# Patient Record
Sex: Female | Born: 1980 | Race: Black or African American | Hispanic: No | Marital: Single | Smoking: Never smoker
Health system: Southern US, Community
[De-identification: ages and names within clinical notes are randomized; demographics above are authoritative.]

## PROBLEM LIST (undated history)

## (undated) ENCOUNTER — Inpatient Hospital Stay (HOSPITAL_COMMUNITY): Payer: Self-pay

## (undated) DIAGNOSIS — E079 Disorder of thyroid, unspecified: Secondary | ICD-10-CM

## (undated) DIAGNOSIS — F32A Depression, unspecified: Secondary | ICD-10-CM

## (undated) DIAGNOSIS — F419 Anxiety disorder, unspecified: Secondary | ICD-10-CM

## (undated) DIAGNOSIS — D649 Anemia, unspecified: Secondary | ICD-10-CM

## (undated) DIAGNOSIS — I10 Essential (primary) hypertension: Secondary | ICD-10-CM

## (undated) DIAGNOSIS — K219 Gastro-esophageal reflux disease without esophagitis: Secondary | ICD-10-CM

## (undated) DIAGNOSIS — F329 Major depressive disorder, single episode, unspecified: Secondary | ICD-10-CM

## (undated) DIAGNOSIS — Z5189 Encounter for other specified aftercare: Secondary | ICD-10-CM

## (undated) HISTORY — DX: Anxiety disorder, unspecified: F41.9

## (undated) HISTORY — DX: Depression, unspecified: F32.A

## (undated) HISTORY — DX: Gastro-esophageal reflux disease without esophagitis: K21.9

## (undated) HISTORY — DX: Essential (primary) hypertension: I10

## (undated) HISTORY — DX: Disorder of thyroid, unspecified: E07.9

## (undated) HISTORY — DX: Encounter for other specified aftercare: Z51.89

## (undated) HISTORY — DX: Major depressive disorder, single episode, unspecified: F32.9

## (undated) HISTORY — DX: Anemia, unspecified: D64.9

## (undated) HISTORY — PX: EYE SURGERY: SHX253

---

## 2008-10-19 ENCOUNTER — Encounter: Admission: RE | Admit: 2008-10-19 | Discharge: 2008-10-19 | Payer: Self-pay | Admitting: Internal Medicine

## 2008-10-21 ENCOUNTER — Emergency Department (HOSPITAL_COMMUNITY): Admission: EM | Admit: 2008-10-21 | Discharge: 2008-10-21 | Payer: Self-pay | Admitting: Family Medicine

## 2009-12-06 ENCOUNTER — Emergency Department (HOSPITAL_BASED_OUTPATIENT_CLINIC_OR_DEPARTMENT_OTHER): Admission: EM | Admit: 2009-12-06 | Discharge: 2009-12-06 | Payer: Self-pay | Admitting: Emergency Medicine

## 2010-07-03 ENCOUNTER — Ambulatory Visit: Payer: Self-pay | Admitting: Obstetrics & Gynecology

## 2010-07-03 ENCOUNTER — Encounter: Payer: Self-pay | Admitting: Obstetrics and Gynecology

## 2010-07-03 LAB — CONVERTED CEMR LAB
Antibody Screen: NEGATIVE
Basophils Absolute: 0 10*3/uL (ref 0.0–0.1)
Basophils Relative: 0 % (ref 0–1)
Eosinophils Absolute: 0 10*3/uL (ref 0.0–0.7)
Eosinophils Relative: 0 % (ref 0–5)
Free T4: 3.33 ng/dL — ABNORMAL HIGH (ref 0.80–1.80)
HCT: 37.3 % (ref 36.0–46.0)
Hemoglobin: 12.6 g/dL (ref 12.0–15.0)
Hepatitis B Surface Ag: NEGATIVE
Lymphocytes Relative: 19 % (ref 12–46)
Lymphs Abs: 1.9 10*3/uL (ref 0.7–4.0)
MCHC: 33.8 g/dL (ref 30.0–36.0)
MCV: 80.2 fL (ref 78.0–100.0)
Monocytes Absolute: 0.7 10*3/uL (ref 0.1–1.0)
Monocytes Relative: 7 % (ref 3–12)
Neutro Abs: 7.6 10*3/uL (ref 1.7–7.7)
Neutrophils Relative %: 74 % (ref 43–77)
Pap Smear: NEGATIVE
Platelets: 352 10*3/uL (ref 150–400)
RBC: 4.65 M/uL (ref 3.87–5.11)
RDW: 13.6 % (ref 11.5–15.5)
Rh Type: POSITIVE
Rubella: 31.8 intl units/mL — ABNORMAL HIGH
T3, Free: 10.9 pg/mL — ABNORMAL HIGH (ref 2.3–4.2)
TSH: <0.004 microintl units/mL — ABNORMAL LOW (ref 0.350–4.500)
WBC: 10.3 10*3/uL (ref 4.0–10.5)

## 2010-07-04 ENCOUNTER — Encounter (INDEPENDENT_AMBULATORY_CARE_PROVIDER_SITE_OTHER): Payer: Self-pay | Admitting: *Deleted

## 2010-07-05 ENCOUNTER — Ambulatory Visit (HOSPITAL_COMMUNITY): Admission: RE | Admit: 2010-07-05 | Discharge: 2010-07-05 | Payer: Self-pay | Admitting: Family Medicine

## 2010-07-10 ENCOUNTER — Ambulatory Visit: Payer: Self-pay | Admitting: Obstetrics & Gynecology

## 2010-07-10 ENCOUNTER — Encounter (INDEPENDENT_AMBULATORY_CARE_PROVIDER_SITE_OTHER): Payer: Self-pay | Admitting: *Deleted

## 2010-07-24 ENCOUNTER — Ambulatory Visit: Payer: Self-pay | Admitting: Obstetrics and Gynecology

## 2010-08-01 ENCOUNTER — Ambulatory Visit (HOSPITAL_COMMUNITY): Admission: RE | Admit: 2010-08-01 | Discharge: 2010-08-01 | Payer: Self-pay | Admitting: Obstetrics & Gynecology

## 2010-08-07 ENCOUNTER — Encounter: Payer: Self-pay | Admitting: Physician Assistant

## 2010-08-07 ENCOUNTER — Ambulatory Visit: Payer: Self-pay | Admitting: Family Medicine

## 2010-08-08 ENCOUNTER — Encounter: Payer: Self-pay | Admitting: Physician Assistant

## 2010-08-08 LAB — CONVERTED CEMR LAB: HIV: NONREACTIVE

## 2010-08-22 ENCOUNTER — Inpatient Hospital Stay (HOSPITAL_COMMUNITY): Admission: AD | Admit: 2010-08-22 | Discharge: 2010-08-22 | Payer: Self-pay | Admitting: Obstetrics & Gynecology

## 2010-08-22 ENCOUNTER — Ambulatory Visit (HOSPITAL_COMMUNITY)
Admission: RE | Admit: 2010-08-22 | Discharge: 2010-08-22 | Payer: Self-pay | Source: Home / Self Care | Admitting: Obstetrics & Gynecology

## 2010-08-28 ENCOUNTER — Ambulatory Visit: Payer: Self-pay | Admitting: Obstetrics & Gynecology

## 2010-09-11 ENCOUNTER — Ambulatory Visit (HOSPITAL_COMMUNITY)
Admission: RE | Admit: 2010-09-11 | Discharge: 2010-09-11 | Payer: Self-pay | Source: Home / Self Care | Admitting: Obstetrics & Gynecology

## 2010-09-15 ENCOUNTER — Inpatient Hospital Stay (HOSPITAL_COMMUNITY)
Admission: AD | Admit: 2010-09-15 | Discharge: 2010-09-15 | Payer: Self-pay | Source: Home / Self Care | Attending: Family Medicine | Admitting: Family Medicine

## 2010-09-18 ENCOUNTER — Encounter: Payer: Self-pay | Admitting: Obstetrics & Gynecology

## 2010-09-18 ENCOUNTER — Ambulatory Visit: Payer: Self-pay | Admitting: Family Medicine

## 2010-10-16 ENCOUNTER — Ambulatory Visit (HOSPITAL_COMMUNITY)
Admission: RE | Admit: 2010-10-16 | Discharge: 2010-10-16 | Payer: Self-pay | Source: Home / Self Care | Attending: Obstetrics & Gynecology | Admitting: Obstetrics & Gynecology

## 2010-10-16 ENCOUNTER — Ambulatory Visit
Admission: RE | Admit: 2010-10-16 | Discharge: 2010-10-16 | Payer: Self-pay | Source: Home / Self Care | Attending: Obstetrics and Gynecology | Admitting: Obstetrics and Gynecology

## 2010-10-23 LAB — POCT URINALYSIS DIPSTICK
Bilirubin Urine: NEGATIVE
Ketones, ur: NEGATIVE mg/dL
Nitrite: NEGATIVE
Protein, ur: 30 mg/dL — AB
Specific Gravity, Urine: >=1.03 (ref 1.005–1.030)
Urine Glucose, Fasting: NEGATIVE mg/dL
Urobilinogen, UA: 1 mg/dL (ref 0.0–1.0)
pH: 6.5 (ref 5.0–8.0)

## 2010-10-26 ENCOUNTER — Ambulatory Visit (HOSPITAL_COMMUNITY)
Admission: RE | Admit: 2010-10-26 | Discharge: 2010-10-26 | Payer: Self-pay | Source: Home / Self Care | Attending: Obstetrics & Gynecology | Admitting: Obstetrics & Gynecology

## 2010-10-26 ENCOUNTER — Encounter: Payer: Self-pay | Admitting: Family Medicine

## 2010-10-26 ENCOUNTER — Ambulatory Visit
Admission: RE | Admit: 2010-10-26 | Discharge: 2010-10-26 | Payer: Self-pay | Source: Home / Self Care | Attending: Obstetrics & Gynecology | Admitting: Obstetrics & Gynecology

## 2010-10-26 LAB — CONVERTED CEMR LAB
Chlamydia, DNA Probe: NEGATIVE
GC Probe Amp, Genital: NEGATIVE

## 2010-10-27 ENCOUNTER — Encounter: Payer: Self-pay | Admitting: Obstetrics & Gynecology

## 2010-10-27 ENCOUNTER — Encounter: Payer: Self-pay | Admitting: Family Medicine

## 2010-10-27 LAB — CONVERTED CEMR LAB
Trich, Wet Prep: NONE SEEN
Yeast Wet Prep HPF POC: NONE SEEN

## 2010-10-30 ENCOUNTER — Ambulatory Visit: Admit: 2010-10-30 | Payer: Self-pay | Admitting: Obstetrics & Gynecology

## 2010-10-30 LAB — POCT URINALYSIS DIPSTICK
Bilirubin Urine: NEGATIVE
Nitrite: NEGATIVE
Protein, ur: 30 mg/dL — AB
Specific Gravity, Urine: 1.025 (ref 1.005–1.030)
Urine Glucose, Fasting: NEGATIVE mg/dL
Urobilinogen, UA: 0.2 mg/dL (ref 0.0–1.0)
pH: 6.5 (ref 5.0–8.0)

## 2010-11-06 ENCOUNTER — Encounter: Payer: Self-pay | Admitting: Obstetrics & Gynecology

## 2010-11-06 ENCOUNTER — Ambulatory Visit
Admission: RE | Admit: 2010-11-06 | Discharge: 2010-11-06 | Payer: Self-pay | Source: Home / Self Care | Attending: Obstetrics and Gynecology | Admitting: Obstetrics and Gynecology

## 2010-11-06 ENCOUNTER — Encounter (INDEPENDENT_AMBULATORY_CARE_PROVIDER_SITE_OTHER): Payer: Self-pay | Admitting: *Deleted

## 2010-11-06 LAB — CONVERTED CEMR LAB
HCT: 32.2 % — ABNORMAL LOW (ref 36.0–46.0)
HIV: NONREACTIVE
Hemoglobin: 10.8 g/dL — ABNORMAL LOW (ref 12.0–15.0)
MCHC: 33.5 g/dL (ref 30.0–36.0)
MCV: 86.8 fL (ref 78.0–100.0)
Platelets: 350 10*3/uL (ref 150–400)
RBC: 3.71 M/uL — ABNORMAL LOW (ref 3.87–5.11)
RDW: 13.8 % (ref 11.5–15.5)
WBC: 9 10*3/uL (ref 4.0–10.5)

## 2010-11-06 LAB — POCT URINALYSIS DIPSTICK
Bilirubin Urine: NEGATIVE
Ketones, ur: NEGATIVE mg/dL
Nitrite: NEGATIVE
Protein, ur: NEGATIVE mg/dL
Specific Gravity, Urine: 1.015 (ref 1.005–1.030)
Urine Glucose, Fasting: NEGATIVE mg/dL
Urobilinogen, UA: 0.2 mg/dL (ref 0.0–1.0)
pH: 7 (ref 5.0–8.0)

## 2010-11-20 ENCOUNTER — Other Ambulatory Visit: Payer: Self-pay

## 2010-11-20 DIAGNOSIS — O10019 Pre-existing essential hypertension complicating pregnancy, unspecified trimester: Secondary | ICD-10-CM

## 2010-11-20 LAB — POCT URINALYSIS DIPSTICK
Bilirubin Urine: NEGATIVE
Nitrite: NEGATIVE
Protein, ur: 30 mg/dL — AB
Specific Gravity, Urine: 1.025 (ref 1.005–1.030)
Urine Glucose, Fasting: NEGATIVE mg/dL
Urobilinogen, UA: 2 mg/dL — ABNORMAL HIGH (ref 0.0–1.0)
pH: 7 (ref 5.0–8.0)

## 2010-12-04 ENCOUNTER — Other Ambulatory Visit: Payer: Self-pay

## 2010-12-04 DIAGNOSIS — IMO0002 Reserved for concepts with insufficient information to code with codable children: Secondary | ICD-10-CM

## 2010-12-04 DIAGNOSIS — O10019 Pre-existing essential hypertension complicating pregnancy, unspecified trimester: Secondary | ICD-10-CM

## 2010-12-04 DIAGNOSIS — O99280 Endocrine, nutritional and metabolic diseases complicating pregnancy, unspecified trimester: Secondary | ICD-10-CM

## 2010-12-04 DIAGNOSIS — E079 Disorder of thyroid, unspecified: Secondary | ICD-10-CM

## 2010-12-04 LAB — POCT URINALYSIS DIPSTICK
Nitrite: NEGATIVE
Protein, ur: 100 mg/dL — AB
Specific Gravity, Urine: >=1.03 (ref 1.005–1.030)
Urine Glucose, Fasting: NEGATIVE mg/dL
Urobilinogen, UA: 1 mg/dL (ref 0.0–1.0)
pH: 7.5 (ref 5.0–8.0)

## 2010-12-05 ENCOUNTER — Encounter: Payer: Self-pay | Admitting: Obstetrics & Gynecology

## 2010-12-18 ENCOUNTER — Other Ambulatory Visit: Payer: Self-pay

## 2010-12-18 DIAGNOSIS — O10019 Pre-existing essential hypertension complicating pregnancy, unspecified trimester: Secondary | ICD-10-CM

## 2010-12-18 DIAGNOSIS — O99280 Endocrine, nutritional and metabolic diseases complicating pregnancy, unspecified trimester: Secondary | ICD-10-CM

## 2010-12-18 DIAGNOSIS — IMO0002 Reserved for concepts with insufficient information to code with codable children: Secondary | ICD-10-CM

## 2010-12-18 DIAGNOSIS — E079 Disorder of thyroid, unspecified: Secondary | ICD-10-CM

## 2010-12-18 LAB — POCT URINALYSIS DIPSTICK
Bilirubin Urine: NEGATIVE
Glucose, UA: NEGATIVE mg/dL
Ketones, ur: NEGATIVE mg/dL
Nitrite: NEGATIVE
Protein, ur: 30 mg/dL — AB
Specific Gravity, Urine: 1.025 (ref 1.005–1.030)
Urobilinogen, UA: 0.2 mg/dL (ref 0.0–1.0)
pH: 7 (ref 5.0–8.0)

## 2010-12-19 LAB — POCT URINALYSIS DIPSTICK
Bilirubin Urine: NEGATIVE
Glucose, UA: NEGATIVE mg/dL
Glucose, UA: NEGATIVE mg/dL
Ketones, ur: 40 mg/dL — AB
Ketones, ur: NEGATIVE mg/dL
Nitrite: NEGATIVE
Nitrite: NEGATIVE
Protein, ur: 30 mg/dL — AB
Protein, ur: NEGATIVE mg/dL
Specific Gravity, Urine: >=1.03 (ref 1.005–1.030)
Specific Gravity, Urine: 1.02 (ref 1.005–1.030)
Urobilinogen, UA: 1 mg/dL (ref 0.0–1.0)
Urobilinogen, UA: 1 mg/dL (ref 0.0–1.0)
pH: 7 (ref 5.0–8.0)
pH: 7 (ref 5.0–8.0)

## 2010-12-20 LAB — POCT URINALYSIS DIPSTICK
Glucose, UA: NEGATIVE mg/dL
Glucose, UA: NEGATIVE mg/dL
Ketones, ur: 15 mg/dL — AB
Ketones, ur: 15 mg/dL — AB
Nitrite: NEGATIVE
Nitrite: NEGATIVE
Protein, ur: 30 mg/dL — AB
Protein, ur: 30 mg/dL — AB
Specific Gravity, Urine: 1.025 (ref 1.005–1.030)
Specific Gravity, Urine: 1.025 (ref 1.005–1.030)
Urobilinogen, UA: 0.2 mg/dL (ref 0.0–1.0)
Urobilinogen, UA: 1 mg/dL (ref 0.0–1.0)
pH: 6.5 (ref 5.0–8.0)
pH: 6.5 (ref 5.0–8.0)

## 2010-12-21 ENCOUNTER — Other Ambulatory Visit: Payer: Medicaid Other

## 2010-12-21 DIAGNOSIS — O10019 Pre-existing essential hypertension complicating pregnancy, unspecified trimester: Secondary | ICD-10-CM

## 2010-12-21 DIAGNOSIS — O99280 Endocrine, nutritional and metabolic diseases complicating pregnancy, unspecified trimester: Secondary | ICD-10-CM

## 2010-12-21 DIAGNOSIS — E079 Disorder of thyroid, unspecified: Secondary | ICD-10-CM

## 2010-12-21 LAB — POCT URINALYSIS DIPSTICK
Bilirubin Urine: NEGATIVE
Glucose, UA: NEGATIVE mg/dL
Glucose, UA: NEGATIVE mg/dL
Ketones, ur: NEGATIVE mg/dL
Nitrite: NEGATIVE
Nitrite: NEGATIVE
Protein, ur: 30 mg/dL — AB
Protein, ur: 30 mg/dL — AB
Specific Gravity, Urine: 1.02 (ref 1.005–1.030)
Specific Gravity, Urine: 1.02 (ref 1.005–1.030)
Urobilinogen, UA: 0.2 mg/dL (ref 0.0–1.0)
Urobilinogen, UA: 1 mg/dL (ref 0.0–1.0)
pH: 7 (ref 5.0–8.0)
pH: 7 (ref 5.0–8.0)

## 2010-12-21 LAB — POCT PREGNANCY, URINE: Preg Test, Ur: POSITIVE

## 2010-12-22 ENCOUNTER — Ambulatory Visit: Payer: Medicaid Other | Admitting: Obstetrics and Gynecology

## 2010-12-25 ENCOUNTER — Other Ambulatory Visit: Payer: Medicaid Other

## 2010-12-25 ENCOUNTER — Other Ambulatory Visit: Payer: Self-pay

## 2010-12-25 ENCOUNTER — Other Ambulatory Visit: Payer: Self-pay | Admitting: Family Medicine

## 2010-12-25 DIAGNOSIS — O10019 Pre-existing essential hypertension complicating pregnancy, unspecified trimester: Secondary | ICD-10-CM

## 2010-12-25 DIAGNOSIS — O368190 Decreased fetal movements, unspecified trimester, not applicable or unspecified: Secondary | ICD-10-CM

## 2010-12-25 DIAGNOSIS — E079 Disorder of thyroid, unspecified: Secondary | ICD-10-CM

## 2010-12-25 DIAGNOSIS — O99280 Endocrine, nutritional and metabolic diseases complicating pregnancy, unspecified trimester: Secondary | ICD-10-CM

## 2010-12-25 LAB — POCT URINALYSIS DIP (DEVICE)
Glucose, UA: NEGATIVE mg/dL
Ketones, ur: NEGATIVE mg/dL
Protein, ur: NEGATIVE mg/dL
Specific Gravity, Urine: 1.02 (ref 1.005–1.030)

## 2010-12-28 ENCOUNTER — Other Ambulatory Visit: Payer: Medicaid Other

## 2010-12-28 DIAGNOSIS — E079 Disorder of thyroid, unspecified: Secondary | ICD-10-CM

## 2010-12-28 DIAGNOSIS — Z331 Pregnant state, incidental: Secondary | ICD-10-CM

## 2010-12-28 DIAGNOSIS — O99280 Endocrine, nutritional and metabolic diseases complicating pregnancy, unspecified trimester: Secondary | ICD-10-CM

## 2010-12-28 DIAGNOSIS — O10019 Pre-existing essential hypertension complicating pregnancy, unspecified trimester: Secondary | ICD-10-CM

## 2011-01-01 ENCOUNTER — Encounter (INDEPENDENT_AMBULATORY_CARE_PROVIDER_SITE_OTHER): Payer: Self-pay | Admitting: *Deleted

## 2011-01-01 ENCOUNTER — Other Ambulatory Visit: Payer: Medicaid Other

## 2011-01-01 ENCOUNTER — Other Ambulatory Visit: Payer: Self-pay | Admitting: Family Medicine

## 2011-01-01 ENCOUNTER — Other Ambulatory Visit: Payer: Self-pay | Admitting: Obstetrics and Gynecology

## 2011-01-01 ENCOUNTER — Ambulatory Visit (HOSPITAL_COMMUNITY)
Admission: RE | Admit: 2011-01-01 | Discharge: 2011-01-01 | Disposition: A | Discharge status: Home / Self Care | Payer: Medicaid Other | Source: Ambulatory Visit | Attending: Family Medicine | Admitting: Family Medicine

## 2011-01-01 DIAGNOSIS — O10019 Pre-existing essential hypertension complicating pregnancy, unspecified trimester: Secondary | ICD-10-CM | POA: Insufficient documentation

## 2011-01-01 DIAGNOSIS — O368190 Decreased fetal movements, unspecified trimester, not applicable or unspecified: Secondary | ICD-10-CM

## 2011-01-01 DIAGNOSIS — Z331 Pregnant state, incidental: Secondary | ICD-10-CM

## 2011-01-01 DIAGNOSIS — E079 Disorder of thyroid, unspecified: Secondary | ICD-10-CM | POA: Insufficient documentation

## 2011-01-01 DIAGNOSIS — E0590 Thyrotoxicosis, unspecified without thyrotoxic crisis or storm: Secondary | ICD-10-CM | POA: Insufficient documentation

## 2011-01-01 DIAGNOSIS — O169 Unspecified maternal hypertension, unspecified trimester: Secondary | ICD-10-CM

## 2011-01-01 LAB — POCT URINALYSIS DIP (DEVICE)
Glucose, UA: NEGATIVE mg/dL
Protein, ur: 30 mg/dL — AB
Urobilinogen, UA: 1 mg/dL (ref 0.0–1.0)

## 2011-01-01 LAB — PREGNANCY, URINE: Preg Test, Ur: NEGATIVE

## 2011-01-01 LAB — URINE CULTURE: Colony Count: >=100000

## 2011-01-01 LAB — URINALYSIS, ROUTINE W REFLEX MICROSCOPIC
Bilirubin Urine: NEGATIVE
Ketones, ur: NEGATIVE mg/dL
Nitrite: NEGATIVE
Specific Gravity, Urine: 1.018 (ref 1.005–1.030)
Urobilinogen, UA: 0.2 mg/dL (ref 0.0–1.0)

## 2011-01-01 LAB — URINE MICROSCOPIC-ADD ON

## 2011-01-04 ENCOUNTER — Other Ambulatory Visit: Payer: Medicaid Other

## 2011-01-04 DIAGNOSIS — O169 Unspecified maternal hypertension, unspecified trimester: Secondary | ICD-10-CM

## 2011-01-08 ENCOUNTER — Other Ambulatory Visit: Payer: Medicaid Other

## 2011-01-08 ENCOUNTER — Other Ambulatory Visit: Payer: Self-pay | Admitting: Obstetrics & Gynecology

## 2011-01-08 DIAGNOSIS — O10019 Pre-existing essential hypertension complicating pregnancy, unspecified trimester: Secondary | ICD-10-CM

## 2011-01-08 DIAGNOSIS — O99280 Endocrine, nutritional and metabolic diseases complicating pregnancy, unspecified trimester: Secondary | ICD-10-CM

## 2011-01-08 DIAGNOSIS — Z331 Pregnant state, incidental: Secondary | ICD-10-CM

## 2011-01-08 DIAGNOSIS — E079 Disorder of thyroid, unspecified: Secondary | ICD-10-CM

## 2011-01-08 LAB — POCT URINALYSIS DIP (DEVICE)
Ketones, ur: 40 mg/dL — AB
Protein, ur: 30 mg/dL — AB
Specific Gravity, Urine: >=1.03 (ref 1.005–1.030)
Urobilinogen, UA: 1 mg/dL (ref 0.0–1.0)
pH: 6.5 (ref 5.0–8.0)

## 2011-01-11 ENCOUNTER — Other Ambulatory Visit: Payer: Medicaid Other

## 2011-01-11 DIAGNOSIS — E079 Disorder of thyroid, unspecified: Secondary | ICD-10-CM

## 2011-01-11 DIAGNOSIS — O99280 Endocrine, nutritional and metabolic diseases complicating pregnancy, unspecified trimester: Secondary | ICD-10-CM

## 2011-01-11 DIAGNOSIS — Z331 Pregnant state, incidental: Secondary | ICD-10-CM

## 2011-01-15 ENCOUNTER — Other Ambulatory Visit: Payer: Medicaid Other

## 2011-01-15 ENCOUNTER — Other Ambulatory Visit: Payer: Self-pay | Admitting: Obstetrics and Gynecology

## 2011-01-15 DIAGNOSIS — E079 Disorder of thyroid, unspecified: Secondary | ICD-10-CM

## 2011-01-15 DIAGNOSIS — IMO0002 Reserved for concepts with insufficient information to code with codable children: Secondary | ICD-10-CM

## 2011-01-15 DIAGNOSIS — Z331 Pregnant state, incidental: Secondary | ICD-10-CM

## 2011-01-15 DIAGNOSIS — O10019 Pre-existing essential hypertension complicating pregnancy, unspecified trimester: Secondary | ICD-10-CM

## 2011-01-15 DIAGNOSIS — O99280 Endocrine, nutritional and metabolic diseases complicating pregnancy, unspecified trimester: Secondary | ICD-10-CM

## 2011-01-15 LAB — POCT URINALYSIS DIP (DEVICE)
Nitrite: NEGATIVE
Protein, ur: 30 mg/dL — AB
Specific Gravity, Urine: 1.025 (ref 1.005–1.030)
Urobilinogen, UA: 0.2 mg/dL (ref 0.0–1.0)
pH: 7 (ref 5.0–8.0)

## 2011-01-18 ENCOUNTER — Other Ambulatory Visit: Payer: Medicaid Other

## 2011-01-18 DIAGNOSIS — E079 Disorder of thyroid, unspecified: Secondary | ICD-10-CM

## 2011-01-18 DIAGNOSIS — O169 Unspecified maternal hypertension, unspecified trimester: Secondary | ICD-10-CM

## 2011-01-18 DIAGNOSIS — O99280 Endocrine, nutritional and metabolic diseases complicating pregnancy, unspecified trimester: Secondary | ICD-10-CM

## 2011-01-22 ENCOUNTER — Other Ambulatory Visit: Payer: Medicaid Other

## 2011-01-22 ENCOUNTER — Ambulatory Visit (HOSPITAL_COMMUNITY)
Admission: RE | Admit: 2011-01-22 | Discharge: 2011-01-22 | Disposition: A | Discharge status: Home / Self Care | Payer: Medicaid Other | Source: Ambulatory Visit | Attending: Family Medicine | Admitting: Family Medicine

## 2011-01-22 ENCOUNTER — Other Ambulatory Visit: Payer: Self-pay | Admitting: Obstetrics and Gynecology

## 2011-01-22 DIAGNOSIS — Z331 Pregnant state, incidental: Secondary | ICD-10-CM

## 2011-01-22 DIAGNOSIS — O368190 Decreased fetal movements, unspecified trimester, not applicable or unspecified: Secondary | ICD-10-CM

## 2011-01-22 DIAGNOSIS — E0590 Thyrotoxicosis, unspecified without thyrotoxic crisis or storm: Secondary | ICD-10-CM | POA: Insufficient documentation

## 2011-01-22 DIAGNOSIS — E079 Disorder of thyroid, unspecified: Secondary | ICD-10-CM | POA: Insufficient documentation

## 2011-01-22 DIAGNOSIS — O10019 Pre-existing essential hypertension complicating pregnancy, unspecified trimester: Secondary | ICD-10-CM

## 2011-01-22 DIAGNOSIS — O99280 Endocrine, nutritional and metabolic diseases complicating pregnancy, unspecified trimester: Secondary | ICD-10-CM

## 2011-01-22 DIAGNOSIS — IMO0002 Reserved for concepts with insufficient information to code with codable children: Secondary | ICD-10-CM

## 2011-01-22 LAB — POCT URINALYSIS DIP (DEVICE)
Nitrite: NEGATIVE
Protein, ur: 100 mg/dL — AB
Urobilinogen, UA: 2 mg/dL — ABNORMAL HIGH (ref 0.0–1.0)
pH: 6.5 (ref 5.0–8.0)

## 2011-01-25 ENCOUNTER — Other Ambulatory Visit: Payer: Medicaid Other

## 2011-01-25 DIAGNOSIS — O169 Unspecified maternal hypertension, unspecified trimester: Secondary | ICD-10-CM

## 2011-01-25 DIAGNOSIS — Z331 Pregnant state, incidental: Secondary | ICD-10-CM

## 2011-01-29 ENCOUNTER — Other Ambulatory Visit: Payer: Medicaid Other

## 2011-01-29 ENCOUNTER — Other Ambulatory Visit: Payer: Self-pay | Admitting: Obstetrics and Gynecology

## 2011-01-29 DIAGNOSIS — O169 Unspecified maternal hypertension, unspecified trimester: Secondary | ICD-10-CM

## 2011-01-29 DIAGNOSIS — O99280 Endocrine, nutritional and metabolic diseases complicating pregnancy, unspecified trimester: Secondary | ICD-10-CM

## 2011-01-29 DIAGNOSIS — Z331 Pregnant state, incidental: Secondary | ICD-10-CM

## 2011-01-29 DIAGNOSIS — E079 Disorder of thyroid, unspecified: Secondary | ICD-10-CM

## 2011-01-29 LAB — POCT URINALYSIS DIP (DEVICE)
Ketones, ur: NEGATIVE mg/dL
Protein, ur: 30 mg/dL — AB
Urobilinogen, UA: 1 mg/dL (ref 0.0–1.0)
pH: 6.5 (ref 5.0–8.0)

## 2011-02-02 ENCOUNTER — Inpatient Hospital Stay (HOSPITAL_COMMUNITY)
Admission: RE | Admit: 2011-02-02 | Discharge: 2011-02-04 | DRG: 774 | Disposition: A | Discharge status: Home / Self Care | Payer: Medicaid Other | Source: Ambulatory Visit | Attending: Obstetrics & Gynecology | Admitting: Obstetrics & Gynecology

## 2011-02-02 DIAGNOSIS — O1002 Pre-existing essential hypertension complicating childbirth: Principal | ICD-10-CM | POA: Diagnosis present

## 2011-02-02 DIAGNOSIS — E0590 Thyrotoxicosis, unspecified without thyrotoxic crisis or storm: Secondary | ICD-10-CM

## 2011-02-02 DIAGNOSIS — O99284 Endocrine, nutritional and metabolic diseases complicating childbirth: Secondary | ICD-10-CM

## 2011-02-02 DIAGNOSIS — E079 Disorder of thyroid, unspecified: Secondary | ICD-10-CM | POA: Diagnosis present

## 2011-02-02 LAB — CBC
HCT: 32.2 % — ABNORMAL LOW (ref 36.0–46.0)
MCHC: 32.6 g/dL (ref 30.0–36.0)
Platelets: 305 10*3/uL (ref 150–400)
RDW: 13 % (ref 11.5–15.5)

## 2011-02-03 LAB — SURGICAL PCR SCREEN: Staphylococcus aureus: NEGATIVE

## 2011-02-03 LAB — CBC
HCT: 28.1 % — ABNORMAL LOW (ref 36.0–46.0)
Hemoglobin: 9.2 g/dL — ABNORMAL LOW (ref 12.0–15.0)
MCH: 28 pg (ref 26.0–34.0)
MCHC: 32.7 g/dL (ref 30.0–36.0)
RDW: 13 % (ref 11.5–15.5)

## 2011-02-04 LAB — URINALYSIS, MICROSCOPIC ONLY
Glucose, UA: NEGATIVE mg/dL
Ketones, ur: 15 mg/dL — AB
Protein, ur: NEGATIVE mg/dL

## 2011-03-02 ENCOUNTER — Ambulatory Visit (INDEPENDENT_AMBULATORY_CARE_PROVIDER_SITE_OTHER): Payer: Medicaid Other | Admitting: Obstetrics and Gynecology

## 2011-03-02 NOTE — Group Therapy Note (Signed)
Diana Collier, Diana Collier             ACCOUNT NO.:  [id number]  MEDICAL RECORD NO.:  [id number]           PATIENT TYPE:  A  LOCATION:  [facility]                   FACILITY:  [facility]  PHYSICIAN:  Catalina Antigua, MD     DATE OF BIRTH:  01-07-1981  DATE OF SERVICE:  03/02/2011                                 CLINIC NOTE  HISTORY OF PRESENT ILLNESS:  This is a 30 year old G8, P4-0-4-4 who is status post vaginal delivery on February 02, 2011, who presents today for postpartum check.  The patient reports feeling well.  She denies any signs or symptoms of postpartum depression.  The patient states that she is receiving some help at home.  She initially was breast-feeding and now have switched over to bottle formula feeding.  The patient states that the infant is thriving.  The patient has not initiated intercourse. The patient is interested in Mirena IUD for contraception.  The patient was being followed in our high risk clinic for history of Graves disease.  The patient is currently being managed by her endocrinologist and is on methimazole.  PHYSICAL EXAMINATION:  VITAL SIGNS:  Her blood pressure is 134/88, pulse of 91, weight of 92.2 kg, height of 66 inches. LUNGS:  Clear to auscultation bilaterally. HEART:  Regular rate and rhythm.  Her breasts and nontender, equal in size.  No engorgement.  No palpable masses or lymphadenopathy. ABDOMEN:  Soft, nontender, and nondistended. PELVIC:  Normal-appearing vaginal mucosa, normal-appearing cervix.  No abnormal bleeding or discharge.  She had a very small anteverted uterus. No palpable adnexal masses or tenderness.  ASSESSMENT/PLAN:  This is a 30 year old G8, P4-0-4-4 who is here for a postpartum check.  The patient is medically cleared to resume all activities of daily living.  The patient will continue to follow up with her endocrinologist for management of her Graves disease and the patient will be scheduled for next available for Mirena IUD  insertion.          ______________________________ Catalina Antigua, MD    PC/MEDQ  D:  03/02/2011  T:  03/02/2011  Job:  [id number]

## 2011-03-19 ENCOUNTER — Ambulatory Visit (INDEPENDENT_AMBULATORY_CARE_PROVIDER_SITE_OTHER): Payer: Medicaid Other | Admitting: Family Medicine

## 2011-03-19 ENCOUNTER — Other Ambulatory Visit: Payer: Self-pay | Admitting: Family Medicine

## 2011-03-19 DIAGNOSIS — Z30430 Encounter for insertion of intrauterine contraceptive device: Secondary | ICD-10-CM

## 2011-03-19 LAB — POCT URINALYSIS DIP (DEVICE)
Glucose, UA: NEGATIVE mg/dL
Ketones, ur: NEGATIVE mg/dL
Specific Gravity, Urine: 1.025 (ref 1.005–1.030)

## 2011-03-19 LAB — POCT PREGNANCY, URINE: Preg Test, Ur: NEGATIVE

## 2011-03-20 NOTE — Group Therapy Note (Signed)
Diana Collier, KOERNER NO.:  [id number]  MEDICAL RECORD NO.:  [id number]           PATIENT TYPE:  A  LOCATION:  [facility]                   FACILITY:  [facility]  PHYSICIAN:  Maryelizabeth Kaufmann, MD  DATE OF BIRTH:  1981/04/26  DATE OF SERVICE:  03/19/2011                                 CLINIC NOTE  CHIEF COMPLAINT:  IUD insertion.  HISTORY OF PRESENT ILLNESS:  This is a 30 year old gravida 8, para 4-0-4- 4, status post vaginal delivery on February 02, 2011, presenting for an IUD insertion.  She denies any acute complaints today.  She did have a period on March 11, 2011, that ended 2 days ago.  She is not currently sexually active.  Urine pregnancy test currently negative.  She is currently bottle-feeding.  REVIEW OF SYSTEMS:  Additional review of  systems was notable for concern for urinary tract infection.  PHYSICAL EXAMINATION:  VITAL SIGNS:  The patient's blood pressure 144/90, pulse 101, temperature 98.8, weight 198.3, height 66 inches. GENERAL:  The patient is otherwise in no acute distress. GU:  Sterile vaginal exam was performed, normal external vaginal genitalia.  Prior to that, she did have a bimanual exam, notable to have a multiparous cervix.  Uterus is soft and mobile approximately 6 weeks size.  Normal adnexa bilaterally, nontender with cervical motion tenderness and uterus appeared to be anteverted.  Sterile vaginal exam: Normal external genitalia.  Normal vaginal mucosa.  Cervix was visualized.   PROCEDURE NOTE:  After informed consent was obtained including but not limited to risks/benefits/alternatives to the procedure and other forms of contraception, pt consented for the procedure.  Time out was performed.  The cervix was cleaned with betadine and the anterior lip of the cervix was grasped with a single tooth tenaculum.  The uterus was sounded to about 9 cm.  The IUD was then inserted to 9 cm and placed without any difficulty.  There was some mild  blood.  The strings were cut, and the patient tolerated procedure well.  There were complications.  LABORATORY EVALUATION:  The patient did have a UA that was performed in the office that were notable for positive nitrites and positive leukocyte.  ASSESSMENT AND PLAN: 10. A 30 year old gravida 8, para 4-0-4-4, status post IUD insertion     for postpartum contraception.  Again risks, benefits, and     alternatives were discussed with the patient and she decided to     proceed with IUD insertion.  She tolerated procedure well.  There     were no complications.  We will have the patient to return to     clinic in 1 month for an IUD check. Expulsion precautions were discussed        with the patient. 2. Urinary tract infection. The patient was apparently started on     Bactrim DS 1 tablet p.o. b.i.d.  We did send a urine culture.  We     will call the patient and discussed with the patient, if we need to     change her antibiotics.          ______________________________ Maryelizabeth Kaufmann, MD  LC/MEDQ  D:  03/19/2011  T:  03/20/2011  Job:  [id number]

## 2011-04-18 ENCOUNTER — Encounter: Payer: Self-pay | Admitting: Family Medicine

## 2011-04-18 ENCOUNTER — Ambulatory Visit (INDEPENDENT_AMBULATORY_CARE_PROVIDER_SITE_OTHER): Payer: Medicaid Other | Admitting: Family Medicine

## 2011-04-18 VITALS — BP 131/79 | HR 91 | Temp 97.5°F | Resp 20 | Ht 66.0 in | Wt 198.4 lb

## 2011-04-18 DIAGNOSIS — Z30431 Encounter for routine checking of intrauterine contraceptive device: Secondary | ICD-10-CM

## 2011-04-18 NOTE — Progress Notes (Signed)
  Subjective:    Diana Collier is a 30 y.o. female who presents for IUD string check. The patient reports 19 days of bleeding. The patient is sexually active. Pertinent past medical history: hyperthyroidism, migraines.  Menstrual History: OB History    Grav Para Term Preterm Abortions TAB SAB Ect Mult Living                   Patient's last menstrual period was 03/06/2011.    The following portions of the patient's history were reviewed and updated as appropriate: allergies, current medications, past medical history, past social history, past surgical history and problem list.  Review of Systems Pertinent items are noted in HPI.   Objective:   External Female genitalia: NML Internal Female genitalia: NML with bleeding noted from cervical os. IUD strings noted from cervical os.   Assessment:    30 y.o., continuing IUD, irregular bleeding.  Plan:    Educational material distributed. Discussed that irregular bleeding normal in the first 3 months after placement of IUD. Pt to contact clinic if bleeding continues after 3 months.

## 2011-04-18 NOTE — Patient Instructions (Signed)
You may continue to experience irregular bleeding for up to 3 months after IUD placement. If you continue to have symptoms after this time, please notify our office so that we may see you and consider oral birth control pills in addition to your IUD for a short period of time to regulate your cycles.

## 2012-01-29 IMAGING — US US OB DETAIL+14 WK
1 series · 12 of 28 positions shown · non-contrast
Comparison: none

[Series 1: us ob detail+14 wk · 12 of 68 slices shown]
[im 3/68]
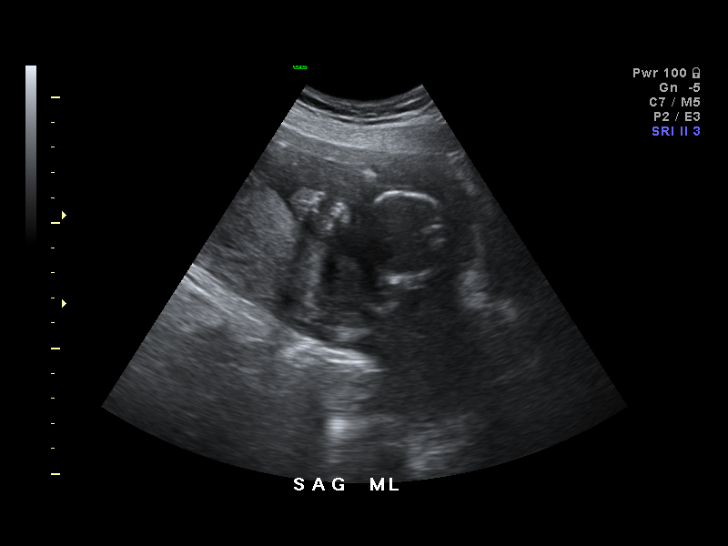
[im 8/68]
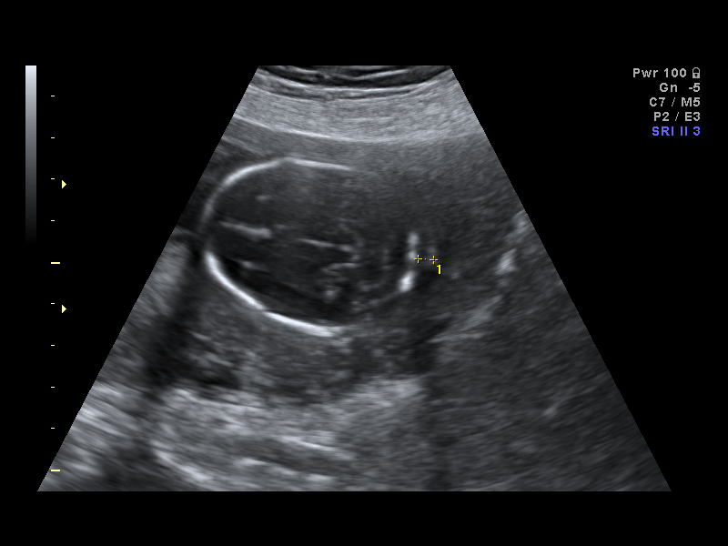
[im 13/68]
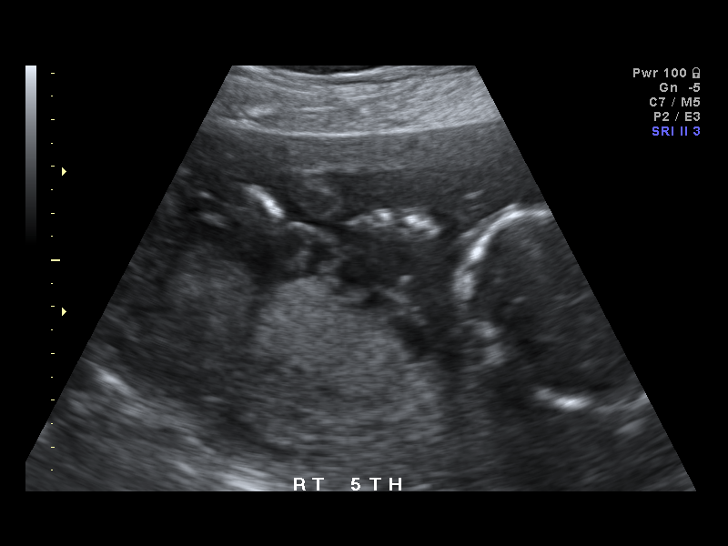
[im 20/68]
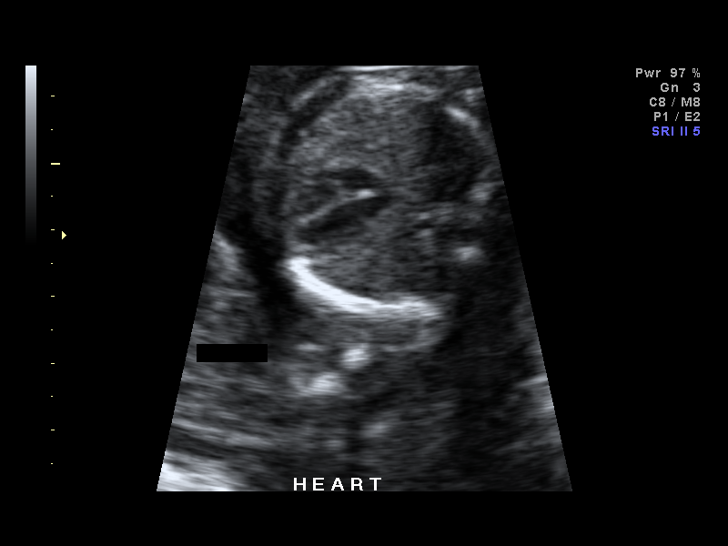
[im 25/68]
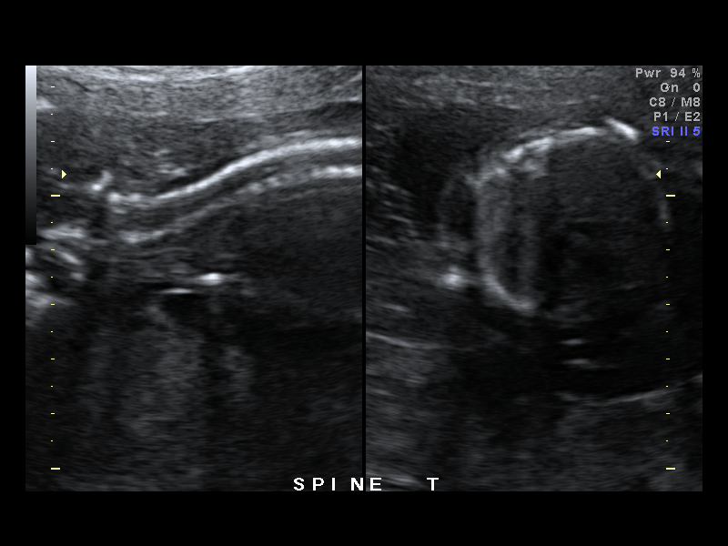
[im 30/68]
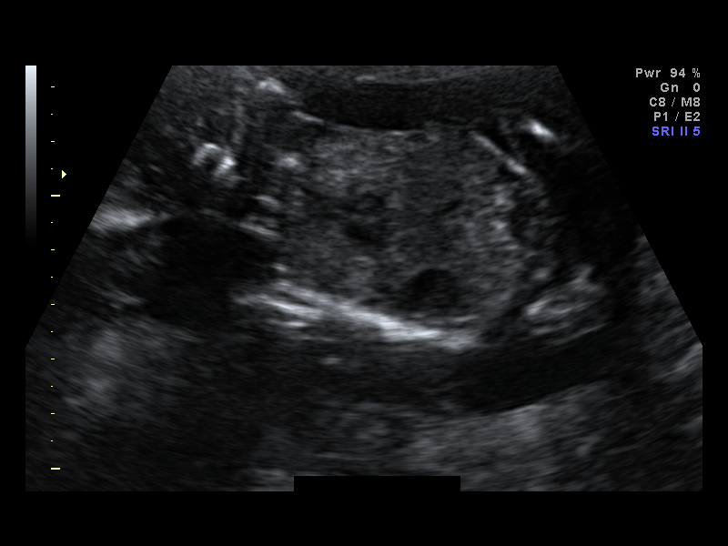
[im 38/68]
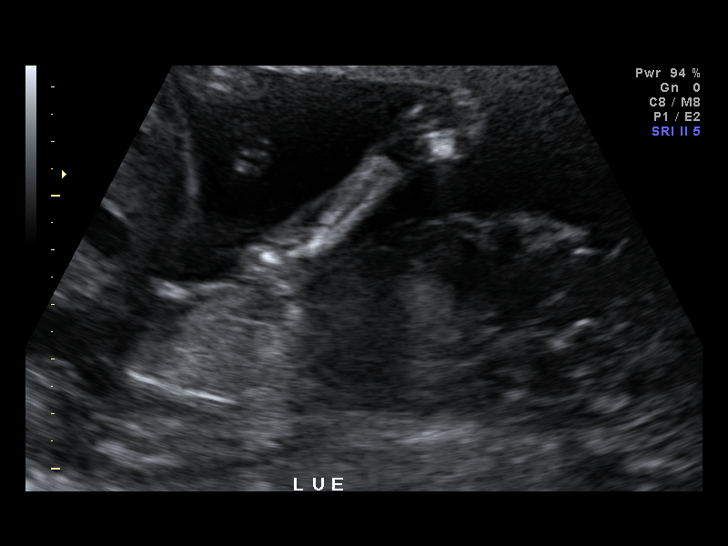
[im 43/68]
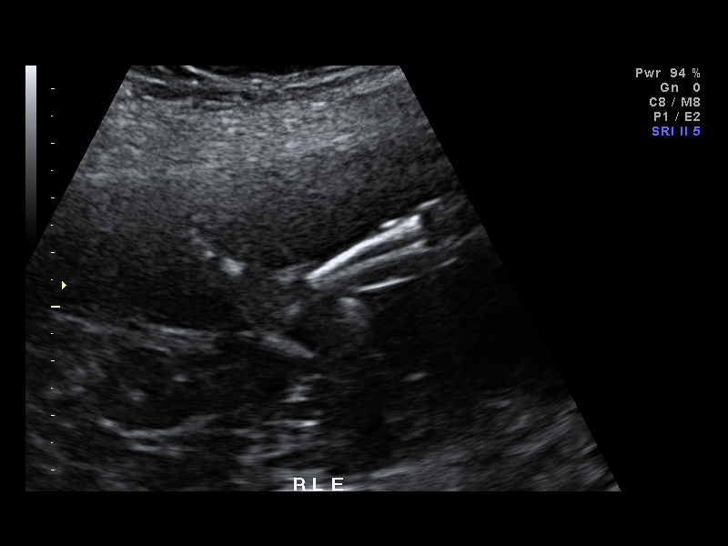
[im 48/68]
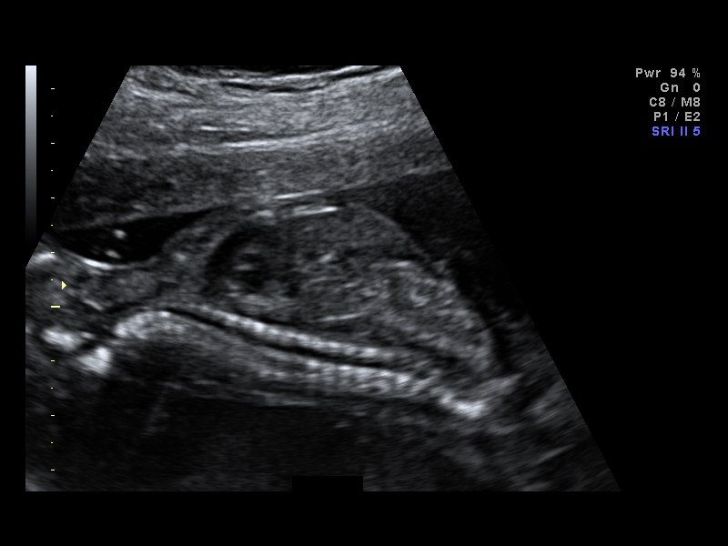
[im 55/68]
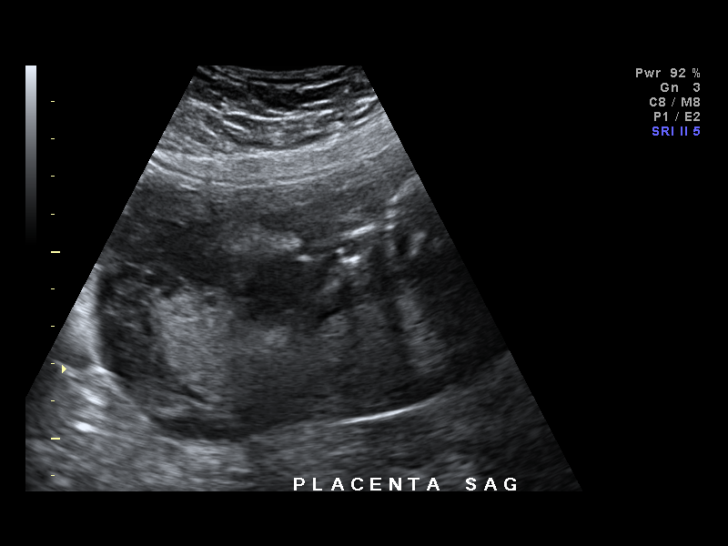
[im 60/68]
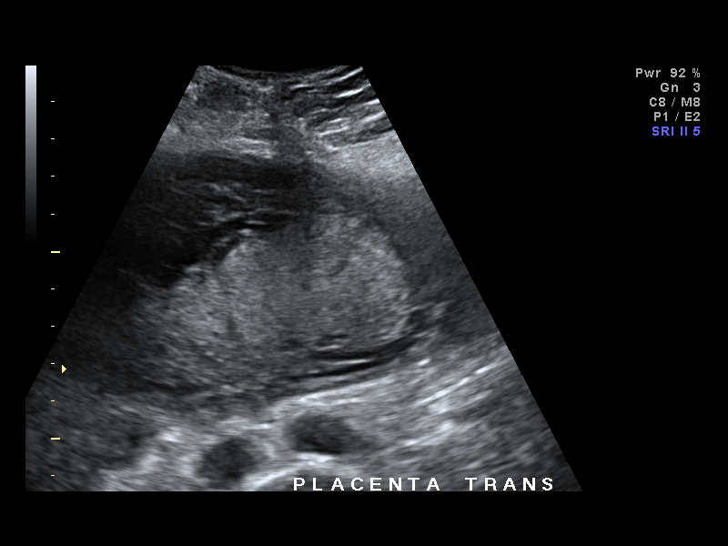
[im 65/68]
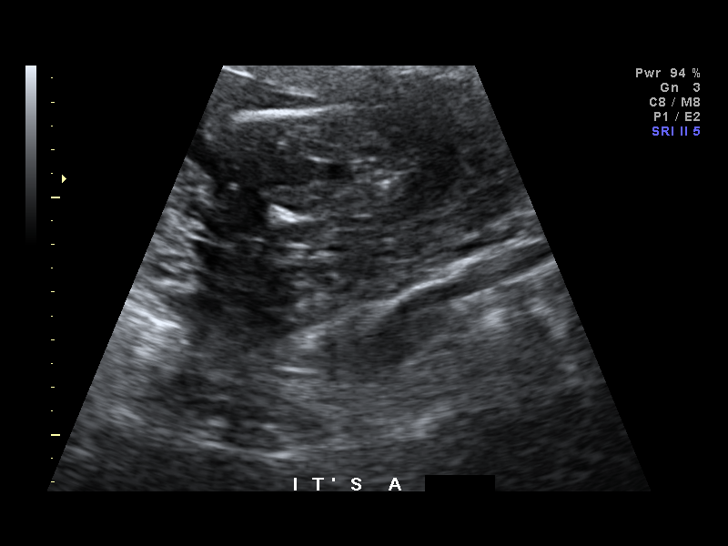

[12 of 28 positions shown; findings below may reference images not displayed]

OBSTETRICS REPORT

 Order#:         _O
Procedures

 US OB DETAIL +14 WK                                   76811.0
Indications

 Detailed fetal anatomic survey
 Hyperthyroidism (currently treated with PTU)
 Recurrent SAB
 Chronic hypertension (no current meds)
Fetal Evaluation

 Fetal Heart Rate:  154                          bpm
 Cardiac Activity:  Observed
 Presentation:      Variable
 Placenta:          Posterior, low lying
 P. Cord            Not well visualized
 Insertion:

 Comment:    The leading placental edge measures 1.7cm from the
             internal cervical os.

 Amniotic Fluid
 AFI FV:      Subjectively within normal limits
                                             Larg Pckt:     5.1  cm
Biometry

 BPD:     40.3  mm     G. Age:  18w 1d                CI:         73.1   70 - 86
 OFD:     55.1  mm                                    FL/HC:      17.4   15.8 -
                                                                         18
 HC:     155.1  mm     G. Age:  18w 3d       44  %    HC/AC:      1.10   1.07 -

 AC:     141.5  mm     G. Age:  19w 4d       80  %    FL/BPD:
 FL:        27  mm     G. Age:  18w 1d       37  %    FL/AC:      19.1   20 - 24
 HUM:     25.9  mm     G. Age:  18w 1d       46  %

 Est. FW:     261  gm      0 lb 9 oz     54  %
Gestational Age

 LMP:           18w 3d        Date:  05/05/10                 EDD:   02/09/11
 U/S Today:     18w 4d                                        EDD:   02/08/11
 Best:          18w 3d     Det. By:  LMP  (05/05/10)          EDD:   02/09/11
Anatomy

 Cranium:           Appears normal      Aortic Arch:       Not well
                                                           visualized
 Fetal Cavum:       Appears normal      Ductal Arch:       Not well
                                                           visualized
 Ventricles:        Appears normal      Diaphragm:         Appears normal
 Choroid Plexus:    Appears normal      Stomach:           Appears
                                                           normal, left
                                                           sided
 Cerebellum:        Appears normal      Abdomen:           Appears normal
 Posterior Fossa:   Appears normal      Abdominal Wall:    Not well
                                                           visualized
 Nuchal Fold:       Appears normal      Cord Vessels:      Appears normal
                    (neck, nuchal                          (3 vessel cord)
                    fold)
 Face:              Orbits appear       Kidneys:           Appear normal
                    normal
 Heart:             Not well            Bladder:           Appears normal
                    visualized
 RVOT:              Not well            Spine:             Appears normal
                    visualized
 LVOT:              Not well            Limbs:             Appears normal
                    visualized                             (hands, ankles,
                                                           feet)

 Other:     Heels and 5th digit appears normal. Fetus appears to be
            a female. Technically difficult due to fetal position and
            maternal body habitus.
Cervix Uterus Adnexa

 Cervix:       Normal appearance by transabdominal scan.

 Left Ovary:    Not visualized.
 Right Ovary:   Not visualized.
 Adnexa:     No abnormality visualized.
Comments

 Patient denies any new symptoms today.  She continues to
 follow with endocrinology and remains on PTU for control of
 hyperthyroidism.

 BP today is 139/81.

 Integrated screen indicates no increased risks for fetal
 chromosome anomalies.  MSAFP is also WNL.

 Review of prior records from previously hospitalization for
 thyroid storm in 9006 reveal a work up for thromboembolic
 conditions with negative studies and no definitive diagnosis of
 DVT or PE.  Given this, we would not recommend
 anticoagulation or any further work up at this time.

 Findings and recommendations discussed with the patient
 today.

 (10 min)
Impression

 IUP at 18w 3d
 Fetal measurements appropriate for gestational age based
 on LMP.
 Normal fetal anatomic survey (limited cardiac views).
 No markers of aneuploidy.
 Normal amniotic fluid volume.
 Low lying placenta.
Recommendations

 Repeat ultrasound scheduled in 4-6 weeks to re evaluate
 fetal growth, anatomy, and amniotic fluid volume.

                     Attending Physician, JANCIC

## 2012-06-10 IMAGING — US US OB FOLLOW-UP
1 series · 14 of 28 positions shown · non-contrast
Comparison: none

[Series 1: us ob follow-up · 14 of 33 slices shown]
[im 2/33]
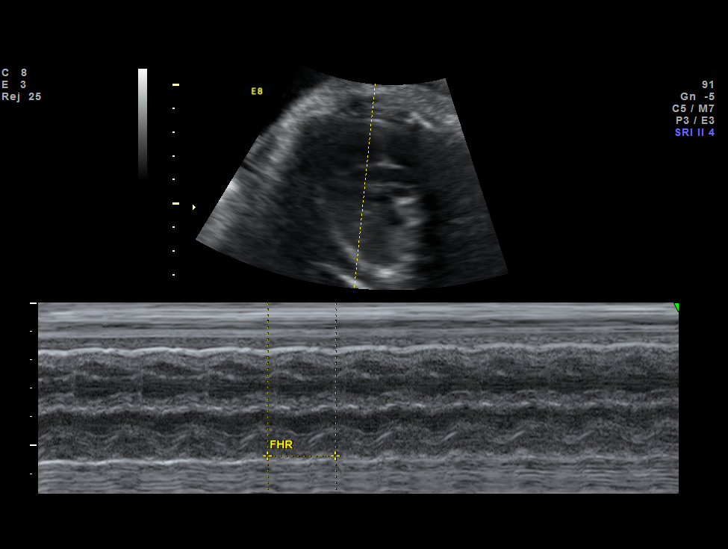
[im 4/33]
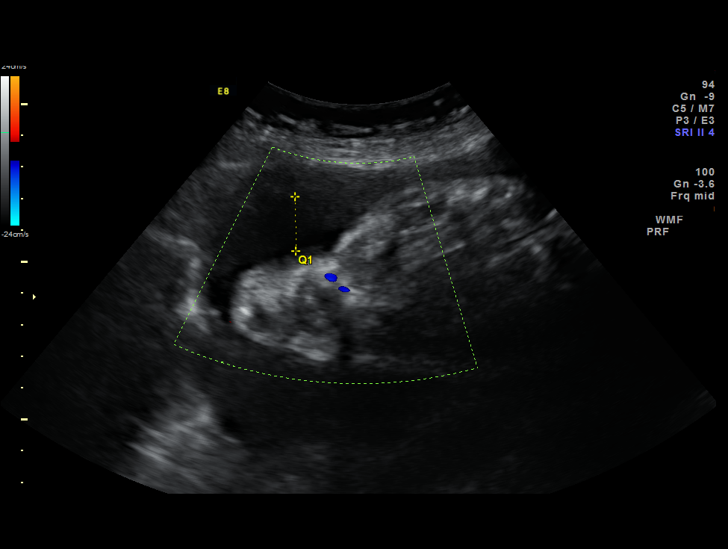
[im 6/33]
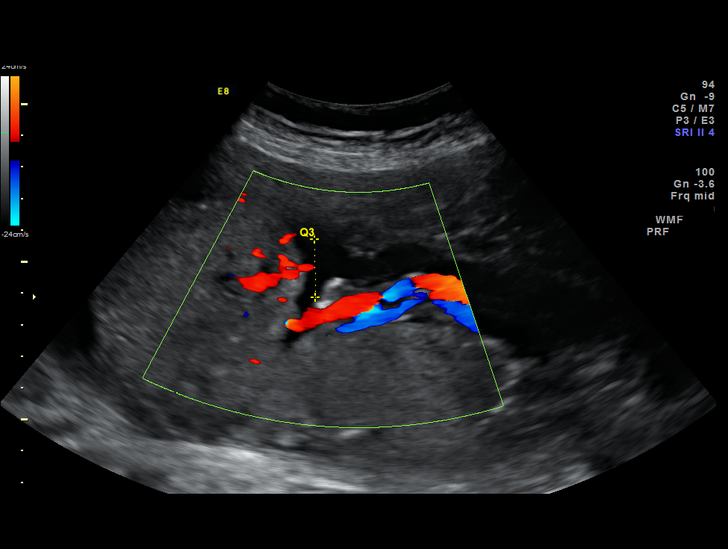
[im 9/33]
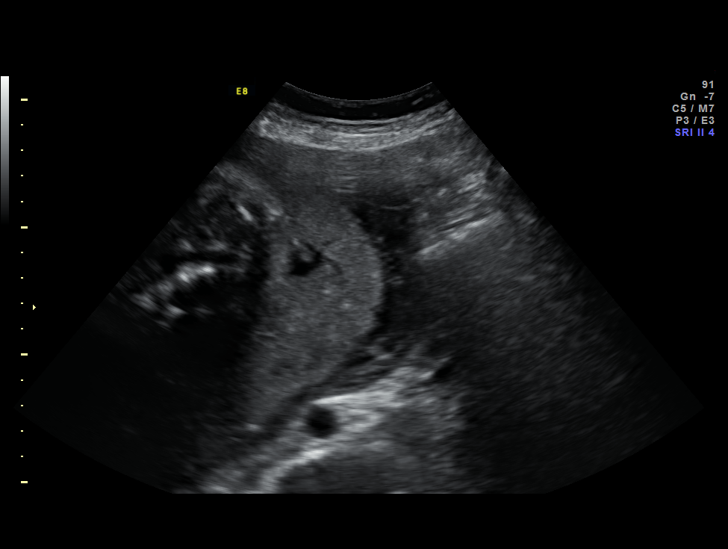
[im 11/33]
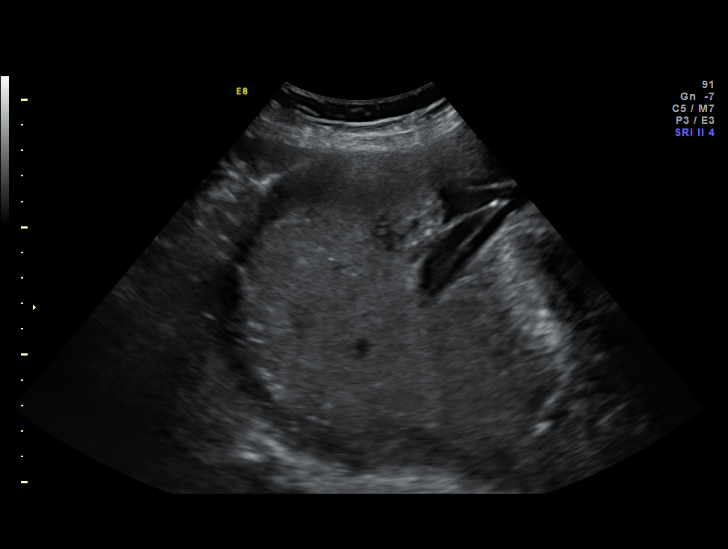
[im 14/33]
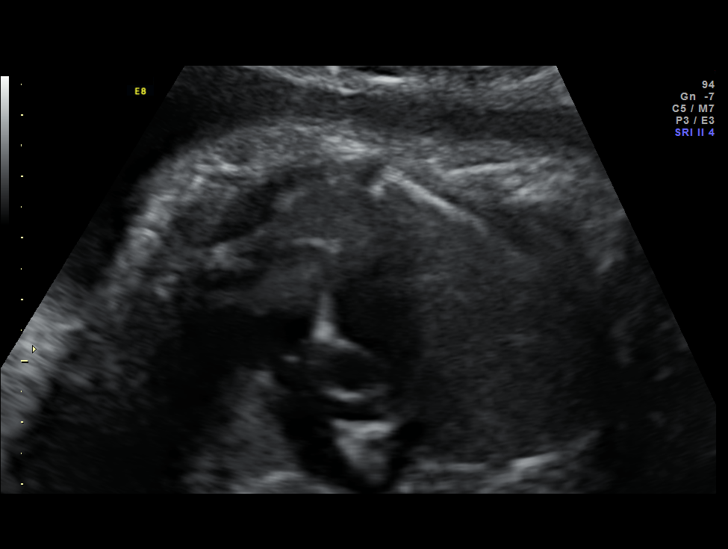
[im 16/33]
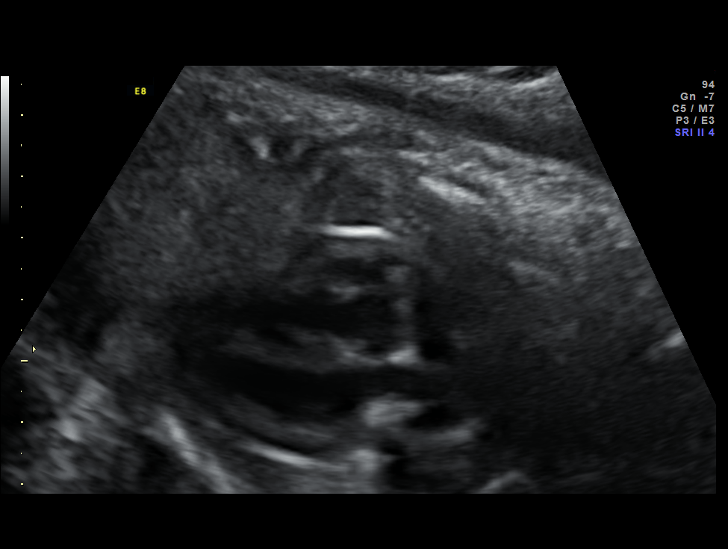
[im 18/33]
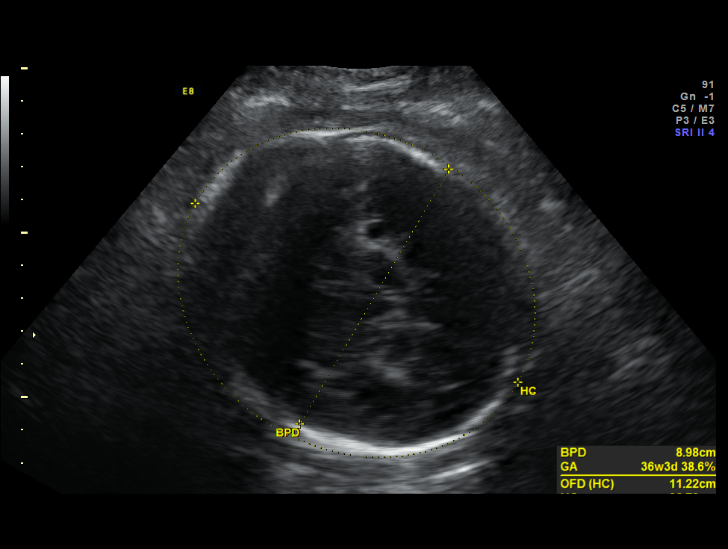
[im 21/33]
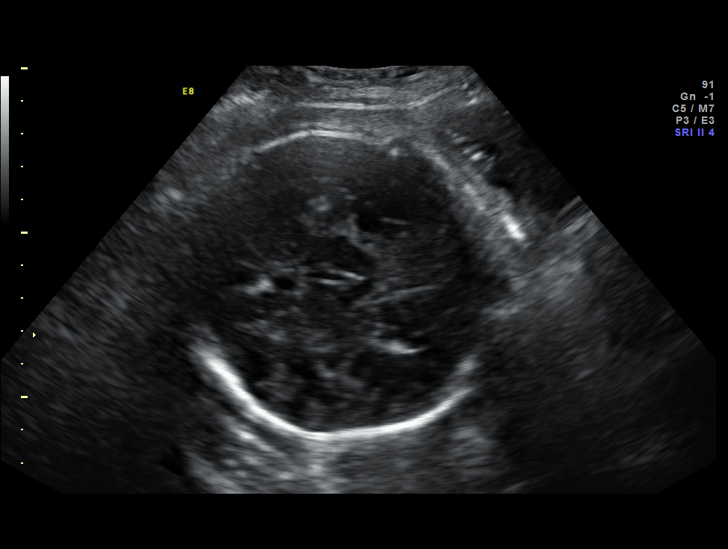
[im 23/33]
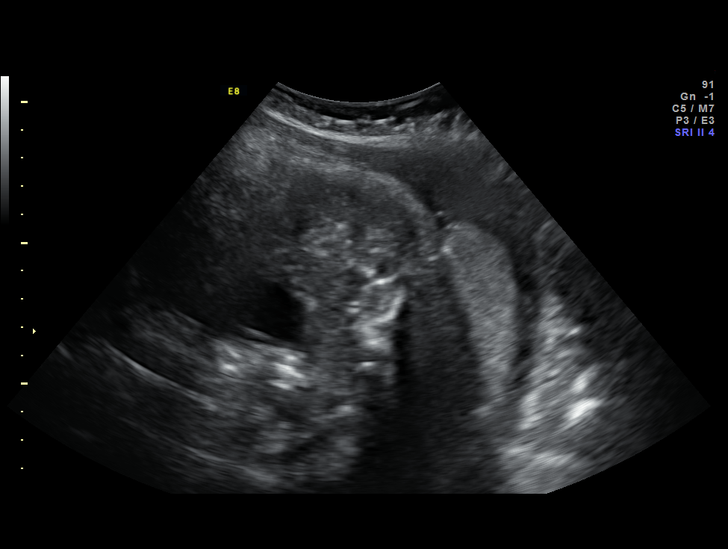
[im 25/33]
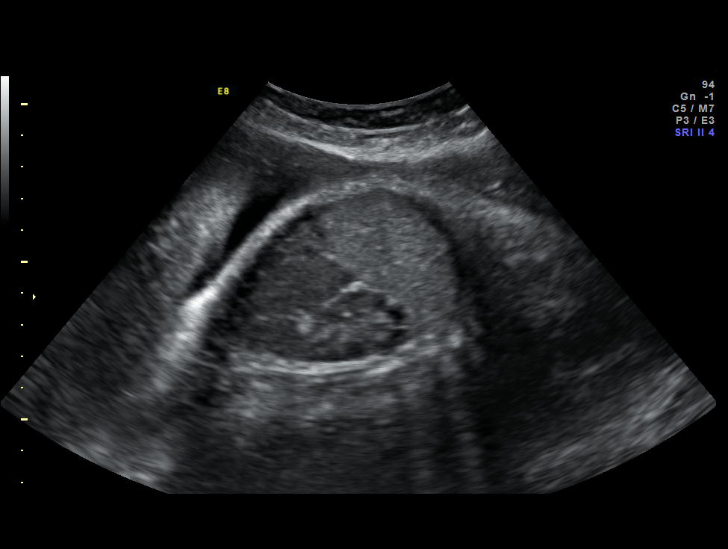
[im 28/33]
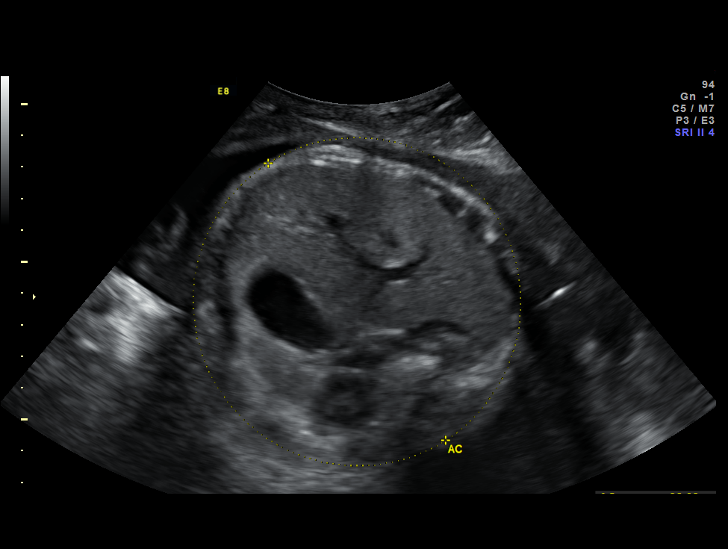
[im 30/33]
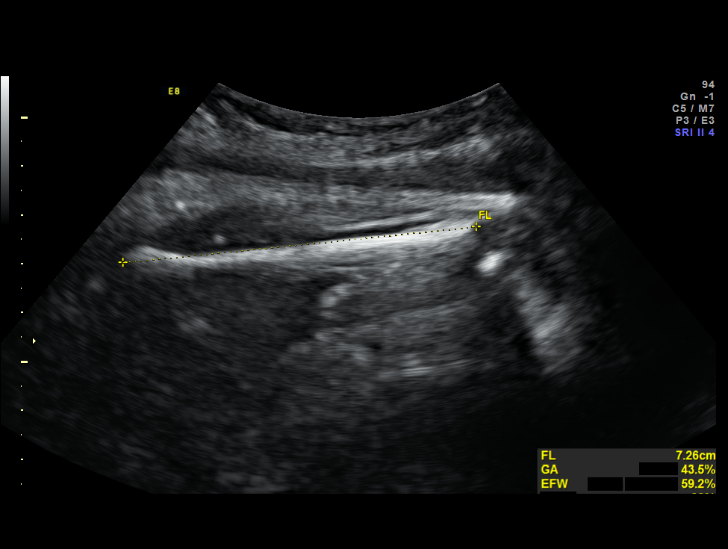
[im 33/33]
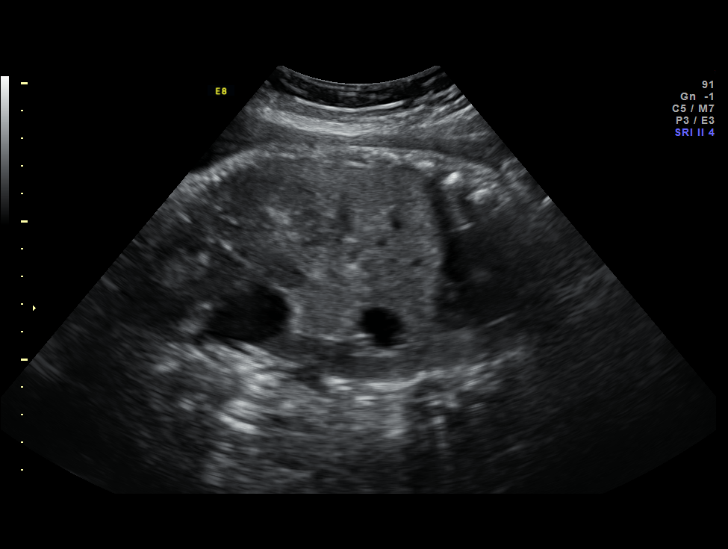

[14 of 28 positions shown; findings below may reference images not displayed]

Canned report from images found in remote index.

Refer to host system for actual result text.

## 2012-09-01 ENCOUNTER — Encounter: Payer: Medicaid Other | Admitting: Obstetrics & Gynecology

## 2012-09-19 ENCOUNTER — Encounter: Payer: Self-pay | Admitting: Obstetrics & Gynecology

## 2012-10-15 ENCOUNTER — Encounter: Payer: Self-pay | Admitting: Obstetrics & Gynecology

## 2012-10-31 ENCOUNTER — Encounter: Payer: Self-pay | Admitting: Obstetrics & Gynecology

## 2013-01-21 ENCOUNTER — Encounter: Payer: Self-pay | Admitting: Family Medicine

## 2013-03-04 ENCOUNTER — Encounter: Payer: Self-pay | Admitting: Obstetrics and Gynecology

## 2013-03-04 DIAGNOSIS — IMO0002 Reserved for concepts with insufficient information to code with codable children: Secondary | ICD-10-CM | POA: Insufficient documentation

## 2013-04-02 ENCOUNTER — Encounter: Payer: Self-pay | Admitting: Obstetrics and Gynecology

## 2015-03-31 ENCOUNTER — Ambulatory Visit: Payer: Self-pay | Admitting: Family Medicine

## 2015-04-13 ENCOUNTER — Ambulatory Visit: Payer: Self-pay | Admitting: Family Medicine

## 2015-07-04 ENCOUNTER — Ambulatory Visit: Payer: Managed Care, Other (non HMO) | Attending: Psychology | Admitting: Psychology

## 2015-08-12 ENCOUNTER — Encounter (HOSPITAL_COMMUNITY): Payer: Self-pay | Admitting: *Deleted

## 2015-08-12 ENCOUNTER — Inpatient Hospital Stay (HOSPITAL_COMMUNITY)
Admission: AD | Admit: 2015-08-12 | Discharge: 2015-08-12 | Disposition: A | Discharge status: Home / Self Care | Payer: Managed Care, Other (non HMO) | Source: Ambulatory Visit | Attending: Obstetrics & Gynecology | Admitting: Obstetrics & Gynecology

## 2015-08-12 ENCOUNTER — Inpatient Hospital Stay (HOSPITAL_COMMUNITY): Payer: Managed Care, Other (non HMO)

## 2015-08-12 DIAGNOSIS — Z3A01 Less than 8 weeks gestation of pregnancy: Secondary | ICD-10-CM | POA: Insufficient documentation

## 2015-08-12 DIAGNOSIS — R1030 Lower abdominal pain, unspecified: Secondary | ICD-10-CM | POA: Insufficient documentation

## 2015-08-12 DIAGNOSIS — O26899 Other specified pregnancy related conditions, unspecified trimester: Secondary | ICD-10-CM

## 2015-08-12 DIAGNOSIS — O9989 Other specified diseases and conditions complicating pregnancy, childbirth and the puerperium: Secondary | ICD-10-CM | POA: Diagnosis not present

## 2015-08-12 DIAGNOSIS — O26891 Other specified pregnancy related conditions, first trimester: Secondary | ICD-10-CM | POA: Insufficient documentation

## 2015-08-12 DIAGNOSIS — R112 Nausea with vomiting, unspecified: Secondary | ICD-10-CM | POA: Insufficient documentation

## 2015-08-12 DIAGNOSIS — K5900 Constipation, unspecified: Secondary | ICD-10-CM | POA: Diagnosis not present

## 2015-08-12 DIAGNOSIS — R319 Hematuria, unspecified: Secondary | ICD-10-CM | POA: Diagnosis not present

## 2015-08-12 DIAGNOSIS — R109 Unspecified abdominal pain: Secondary | ICD-10-CM | POA: Diagnosis not present

## 2015-08-12 LAB — URINALYSIS, ROUTINE W REFLEX MICROSCOPIC
Bilirubin Urine: NEGATIVE
Glucose, UA: NEGATIVE mg/dL
KETONES UR: 15 mg/dL — AB
LEUKOCYTES UA: NEGATIVE
NITRITE: NEGATIVE
PH: 6 (ref 5.0–8.0)
Protein, ur: NEGATIVE mg/dL
SPECIFIC GRAVITY, URINE: 1.025 (ref 1.005–1.030)
UROBILINOGEN UA: 1 mg/dL (ref 0.0–1.0)

## 2015-08-12 LAB — CBC
HEMATOCRIT: 36.5 % (ref 36.0–46.0)
HEMOGLOBIN: 12 g/dL (ref 12.0–15.0)
MCH: 28.8 pg (ref 26.0–34.0)
MCHC: 32.9 g/dL (ref 30.0–36.0)
MCV: 87.5 fL (ref 78.0–100.0)
Platelets: 292 10*3/uL (ref 150–400)
RBC: 4.17 MIL/uL (ref 3.87–5.11)
RDW: 13.9 % (ref 11.5–15.5)
WBC: 9.1 10*3/uL (ref 4.0–10.5)

## 2015-08-12 LAB — POCT PREGNANCY, URINE: PREG TEST UR: POSITIVE — AB

## 2015-08-12 LAB — URINE MICROSCOPIC-ADD ON

## 2015-08-12 LAB — HCG, QUANTITATIVE, PREGNANCY: HCG, BETA CHAIN, QUANT, S: 53907 m[IU]/mL — AB (ref <5)

## 2015-08-12 MED ORDER — MECLIZINE HCL 25 MG PO TABS: 25.0000 mg | ORAL_TABLET | Freq: Three times a day (TID) | ORAL | Status: AC | PRN

## 2015-08-12 MED ORDER — PROMETHAZINE HCL 25 MG PO TABS: 25.0000 mg | ORAL_TABLET | Freq: Four times a day (QID) | ORAL | Status: DC | PRN

## 2015-08-12 NOTE — MAU Note (Addendum)
Pos UPT today [facility], has been having lower abd pain x 2 days, had intercourse last night - pain has become progressively worse since then.  Dx'd with hematuria @ [facility] office today.  Instructed to come to MAU.  Pt states her temp in [facility] office was 100..3, is 98.1 in triage - pt took ibuprofen 30 minutes ago.

## 2015-08-12 NOTE — MAU Provider Note (Signed)
History     CSN: [id number]  Arrival date and time: 08/12/15 1439   None     Chief Complaint  Patient presents with  . Abdominal Pain  . Hematuria   HPI Pt is [redacted]w[redacted]d pregnant by LMP 06/23/2015. Pt is ?G2P1? ( seen in [facility] for hx of preclampsia with previous pregnancy- pt would like to return their for OB care) Pt went to PCP today at [facility] in [facility] and had a positive pregnancy test with lower abdominal pain for 2 days. Pt was also noted to have blood in urine- pt has hx of frequent UTIs.  Pt's pain has progressively gotten worse.  Pt also states that she has been very constipated. Pt is here to R/O ectopic pregnancy. Pt also admits to nausea and vomiting.  Pt had zofran with previous pregnancy.   RN note: Original Note by Arville Care, RN (Registered Nurse) filed at 08/12/2015 2:53 PM   Expand All Collapse All   Pos UPT today @ MD office in [facility], has been having lower abd pain x 2 days, had intercourse last night - pain has become progressively worse since then. Dx'd with hematuria @ MD office today. Instructed to come to MAU. Pt states her temp in [facility] office was 100..3, is 98.1 in triage - pt took ibuprofen 30 minutes ago.       Past Medical History  Diagnosis Date  . Anemia   . Anxiety   . Blood transfusion   . Cataract   . Depression   . GERD (gastroesophageal reflux disease)   . Hypertension   . Thyroid disease     Past Surgical History  Procedure Laterality Date  . Eye surgery      Family History  Problem Relation Age of Onset  . Hypertension Father   . Alcohol abuse Father     substance abuse  . Gout Father   . Hearing loss Brother   . Alcohol abuse Brother   . Hypothyroidism Maternal Aunt   . COPD Maternal Grandmother     emphysema, cong. heart failure  . Cancer Maternal Grandmother     colon  . Clotting disorder Maternal Grandmother   . Hypertension Maternal Aunt   . Depression Maternal Aunt     Social History  Substance  Use Topics  . Smoking status: Never Smoker   . Smokeless tobacco: Not on file  . Alcohol Use: No    Allergies: No Known Allergies  Prescriptions prior to admission  Medication Sig Dispense Refill Last Dose  . Levonorgestrel (MIRENA IU) by Intrauterine route. Remove 03/2016    Taking  . methimazole (TAPAZOLE) 10 MG tablet Take 10 mg by mouth 3 (three) times daily.     Not Taking  . methimazole (TAPAZOLE) 20 MG tablet Take 40 mg by mouth 2 (two) times daily.     Taking  . Prenatal Vit-Fe Psac Cmplx-FA (PRENATAL MULTIVITAMIN) 60-1 MG tablet Take 1 tablet by mouth daily.     Not Taking  . propranolol (INDERAL) 20 MG tablet Take 20 mg by mouth at bedtime.     Taking    Review of Systems  Constitutional: Positive for fever. Negative for chills.  Gastrointestinal: Positive for nausea, vomiting, abdominal pain and constipation. Negative for diarrhea.  Genitourinary: Negative for dysuria and urgency.  Musculoskeletal: Negative for back pain.  Neurological: Negative for dizziness and headaches.   Physical Exam   Blood pressure 127/82, pulse 87, temperature 98.1 F (36.7 C),  temperature source Oral, resp. rate 18, last menstrual period 06/23/2015.  Physical Exam  Nursing note and vitals reviewed. Constitutional: She is oriented to person, place, and time. She appears well-developed and well-nourished. No distress.  HENT:  Head: Normocephalic.  Eyes: Pupils are equal, round, and reactive to light.  Neck: Normal range of motion. Neck supple.  Cardiovascular: Normal rate.   Respiratory: Effort normal.  No CVA tenderness  GI: Soft.  Musculoskeletal: Normal range of motion.  Neurological: She is alert and oriented to person, place, and time.  Skin: Skin is warm and dry.  Psychiatric: She has a normal mood and affect.    MAU Course  Procedures Results for orders placed or performed during the hospital encounter of 08/12/15 (from the past 24 hour(s))  Urinalysis, Routine w reflex  microscopic (not at [facility])     Status: Abnormal   Collection Time: 08/12/15  3:00 PM  Result Value Ref Range   Color, Urine YELLOW YELLOW   APPearance CLEAR CLEAR   Specific Gravity, Urine 1.025 1.005 - 1.030   pH 6.0 5.0 - 8.0   Glucose, UA NEGATIVE NEGATIVE mg/dL   Hgb urine dipstick LARGE (A) NEGATIVE   Bilirubin Urine NEGATIVE NEGATIVE   Ketones, ur 15 (A) NEGATIVE mg/dL   Protein, ur NEGATIVE NEGATIVE mg/dL   Urobilinogen, UA 1.0 0.0 - 1.0 mg/dL   Nitrite NEGATIVE NEGATIVE   Leukocytes, UA NEGATIVE NEGATIVE  Urine microscopic-add on     Status: Abnormal   Collection Time: 08/12/15  3:00 PM  Result Value Ref Range   Squamous Epithelial / LPF FEW (A) RARE   WBC, UA 0-2 <3 WBC/hpf   RBC / HPF 3-6 <3 RBC/hpf   Urine-Other MUCOUS PRESENT   Pregnancy, urine POC     Status: Abnormal   Collection Time: 08/12/15  3:36 PM  Result Value Ref Range   Preg Test, Ur POSITIVE (A) NEGATIVE  hCG, quantitative, pregnancy     Status: Abnormal   Collection Time: 08/12/15  4:00 PM  Result Value Ref Range   hCG, Beta Chain, Quant, S 53907 (H) <5 mIU/mL  CBC     Status: None   Collection Time: 08/12/15  4:00 PM  Result Value Ref Range   WBC 9.1 4.0 - 10.5 K/uL   RBC 4.17 3.87 - 5.11 MIL/uL   Hemoglobin 12.0 12.0 - 15.0 g/dL   HCT 04.5 40.9 - 81.1 %   MCV 87.5 78.0 - 100.0 fL   MCH 28.8 26.0 - 34.0 pg   MCHC 32.9 30.0 - 36.0 g/dL   RDW 91.4 78.2 - 95.6 %   Platelets 292 150 - 400 K/uL  GC/Chlamydia pending on urine Unable to do pelvic exam due to limited exam rooms- ectopic ruled out Urine culture pending US Ob Comp Less 14 Wks  08/12/2015  CLINICAL DATA:  34 year old pregnant female with 2 day history of pelvic pain and hematuria. EXAM: OBSTETRIC <14 WK [place] AND TRANSVAGINAL OB US TECHNIQUE: Both transabdominal and transvaginal ultrasound examinations were performed for complete evaluation of the gestation as well as the maternal uterus, adnexal regions, and pelvic cul-de-sac. Transvaginal  technique was performed to assess early pregnancy. COMPARISON:  Ob ultrasound 01/22/2011. FINDINGS: Intrauterine gestational sac: Single ovoid shaped gestational sac in the endometrial canal. Yolk sac:  Present. Embryo:  Present. Cardiac Activity: Present. Heart Rate: 113  bpm CRL:  4.4  mm   6 w   1 d  US EDC: April 05, 2016. Maternal uterus/adnexae: Small subchorionic hemorrhage. Bilateral ovaries are normal in appearance. No significant free fluid in the cul-de-sac. IMPRESSION: 1. Single viable IUP with estimated gestational age of [redacted] weeks and 1 day and fetal heart rate of 113 beats per minute. 2. Small subchorionic hemorrhage. Electronically Signed   By: Trudie Reedaniel  Entrikin M.D.   On: 08/12/2015 17:55   [place] Ob Transvaginal  08/12/2015  CLINICAL DATA:  34 year old pregnant female with 2 day history of pelvic pain and hematuria. EXAM: OBSTETRIC <14 WK US AND TRANSVAGINAL OB US TECHNIQUE: Both transabdominal and transvaginal ultrasound examinations were performed for complete evaluation of the gestation as well as the maternal uterus, adnexal regions, and pelvic cul-de-sac. Transvaginal technique was performed to assess early pregnancy. COMPARISON:  Ob ultrasound 01/22/2011. FINDINGS: Intrauterine gestational sac: Single ovoid shaped gestational sac in the endometrial canal. Yolk sac:  Present. Embryo:  Present. Cardiac Activity: Present. Heart Rate: 113  bpm CRL:  4.4  mm   6 w   1 d                  US EDC: April 05, 2016. Maternal uterus/adnexae: Small subchorionic hemorrhage. Bilateral ovaries are normal in appearance. No significant free fluid in the cul-de-sac. IMPRESSION: 1. Single viable IUP with estimated gestational age of [redacted] weeks and 1 day and fetal heart rate of 113 beats per minute. 2. Small subchorionic hemorrhage. Electronically Signed   By: Trudie Reedaniel  Entrikin M.D.   On: 08/12/2015 17:55   Assessment and Plan  abd pain in pregnancy SLIUP 2248w1d Hx pre-eclampsia- f/u in [facility]  clinic Constipation- Miralax and Colace Nausea and vomiting- Rx Phenergan and Antivert Return for increase pain or any bleeding Hematuria- urine culture pending   Pascual Mantel 08/12/2015, 6:30 PM

## 2015-08-12 NOTE — Discharge Instructions (Signed)
Safe Medications in Pregnancy  ° °Acne: °Benzoyl Peroxide °Salicylic Acid ° °Backache/Headache: °Tylenol: 2 regular strength every 4 hours OR °             2 Extra strength every 6 hours ° °Colds/Coughs/Allergies: °Benadryl (alcohol free) 25 mg every 6 hours as needed °Breath right strips °Claritin °Cepacol throat lozenges °Chloraseptic throat spray °Cold-Eeze- up to three times per day °Cough drops, alcohol free °Flonase (by prescription only) °Guaifenesin °Mucinex °Robitussin DM (plain only, alcohol free) °Saline nasal spray/drops °Sudafed (pseudoephedrine) & Actifed ** use only after [redacted] weeks gestation and if you do not have high blood pressure °Tylenol °Vicks Vaporub °Zinc lozenges °Zyrtec  ° °Constipation: °Colace °Ducolax suppositories °Fleet enema °Glycerin suppositories °Metamucil °Milk of magnesia °Miralax °Senokot °Smooth move tea ° °Diarrhea: °Kaopectate °Imodium A-D ° °*NO pepto Bismol ° °Hemorrhoids: °Anusol °Anusol HC °Preparation H °Tucks ° °Indigestion: °Tums °Maalox °Mylanta °Zantac  °Pepcid ° °Insomnia: °Benadryl (alcohol free) 25mg every 6 hours as needed °Tylenol PM °Unisom, no Gelcaps ° °Leg Cramps: °Tums °MagGel ° °Nausea/Vomiting:  °Bonine °Dramamine °Emetrol °Ginger extract °Sea bands °Meclizine  °Nausea medication to take during pregnancy:  °Unisom (doxylamine succinate 25 mg tablets) Take one tablet daily at bedtime. If symptoms are not adequately controlled, the dose can be increased to a maximum recommended dose of two tablets daily (1/2 tablet in the morning, 1/2 tablet mid-afternoon and one at bedtime). °Vitamin B6 100mg tablets. Take one tablet twice a day (up to 200 mg per day). ° °Skin Rashes: °Aveeno products °Benadryl cream or 25mg every 6 hours as needed °Calamine Lotion °1% cortisone cream ° °Yeast infection: °Gyne-lotrimin 7 °Monistat 7 ° ° °**If taking multiple medications, please check labels to avoid duplicating the same active ingredients °**take medication as directed on  the label °** Do not exceed 4000 mg of tylenol in 24 hours °**Do not take medications that contain aspirin or ibuprofen ° °  ________________________________________ ° ° ° ° °To schedule your Maternity Eligibility Appointment, please call [phone].  When you arrive for your appointment you must bring the following items or information listed below.  Your appointment will be rescheduled if you do not have these items or are 15 minutes late. °If currently receiving Medicaid, you MUST bring: °1. Medicaid Card °2. Social Security Card °3. Picture ID °4. Proof of Pregnancy °5. Verification of current address if the address on Medicaid card is incorrect "postmarked mail" °If not receiving Medicaid, you MUST bring: °1. Social Security Card °2. Picture ID °3. Birth Certificate (if available) Passport or *Green Card °4. Proof of Pregnancy °5. Verification of current address "postmarked mail" for each income presented. °6. Verification of insurance coverage, if any °7. Check stubs from each employer for the previous month (if unable to present check stub  °for each week, we will accept check stub for the first and last week ill the same month.) If you can't locate check stubs, you must bring a letter from the employer(s) and it must have the following information on letterhead, typed, in English: °o name of company °o company telephone number °o how long been with the company, if less than one month °o how much person earns per hour °o how many hours per week work °o the gross pay the person earned for the previous month °If you are 34 years old or less, you do not have to bring proof of income unless you work or live with the father of the baby and at that time we   will need proof of income from you and/or the father of the baby. °Green Card recipients are eligible for Medicaid for Pregnant Women (MPW) ° ° ° °

## 2015-08-13 LAB — HIV ANTIBODY (ROUTINE TESTING W REFLEX): HIV Screen 4th Generation wRfx: NONREACTIVE

## 2015-08-14 LAB — CULTURE, OB URINE

## 2015-10-25 ENCOUNTER — Emergency Department (HOSPITAL_BASED_OUTPATIENT_CLINIC_OR_DEPARTMENT_OTHER)
Admission: EM | Admit: 2015-10-25 | Discharge: 2015-10-25 | Disposition: A | Discharge status: Home / Self Care | Payer: Managed Care, Other (non HMO) | Attending: Emergency Medicine | Admitting: Emergency Medicine

## 2015-10-25 ENCOUNTER — Encounter (HOSPITAL_BASED_OUTPATIENT_CLINIC_OR_DEPARTMENT_OTHER): Payer: Self-pay | Admitting: *Deleted

## 2015-10-25 ENCOUNTER — Emergency Department (HOSPITAL_BASED_OUTPATIENT_CLINIC_OR_DEPARTMENT_OTHER): Payer: Managed Care, Other (non HMO)

## 2015-10-25 DIAGNOSIS — S3991XA Unspecified injury of abdomen, initial encounter: Secondary | ICD-10-CM | POA: Diagnosis not present

## 2015-10-25 DIAGNOSIS — R110 Nausea: Secondary | ICD-10-CM | POA: Insufficient documentation

## 2015-10-25 DIAGNOSIS — O9989 Other specified diseases and conditions complicating pregnancy, childbirth and the puerperium: Secondary | ICD-10-CM | POA: Diagnosis not present

## 2015-10-25 DIAGNOSIS — Z862 Personal history of diseases of the blood and blood-forming organs and certain disorders involving the immune mechanism: Secondary | ICD-10-CM | POA: Diagnosis not present

## 2015-10-25 DIAGNOSIS — F419 Anxiety disorder, unspecified: Secondary | ICD-10-CM | POA: Insufficient documentation

## 2015-10-25 DIAGNOSIS — Y9289 Other specified places as the place of occurrence of the external cause: Secondary | ICD-10-CM | POA: Diagnosis not present

## 2015-10-25 DIAGNOSIS — E079 Disorder of thyroid, unspecified: Secondary | ICD-10-CM | POA: Diagnosis not present

## 2015-10-25 DIAGNOSIS — O9A212 Injury, poisoning and certain other consequences of external causes complicating pregnancy, second trimester: Secondary | ICD-10-CM | POA: Insufficient documentation

## 2015-10-25 DIAGNOSIS — Y998 Other external cause status: Secondary | ICD-10-CM | POA: Diagnosis not present

## 2015-10-25 DIAGNOSIS — Z79899 Other long term (current) drug therapy: Secondary | ICD-10-CM | POA: Insufficient documentation

## 2015-10-25 DIAGNOSIS — Z3A18 18 weeks gestation of pregnancy: Secondary | ICD-10-CM | POA: Insufficient documentation

## 2015-10-25 DIAGNOSIS — O99282 Endocrine, nutritional and metabolic diseases complicating pregnancy, second trimester: Secondary | ICD-10-CM | POA: Insufficient documentation

## 2015-10-25 DIAGNOSIS — Z8669 Personal history of other diseases of the nervous system and sense organs: Secondary | ICD-10-CM | POA: Diagnosis not present

## 2015-10-25 DIAGNOSIS — Y9389 Activity, other specified: Secondary | ICD-10-CM | POA: Diagnosis not present

## 2015-10-25 DIAGNOSIS — O26899 Other specified pregnancy related conditions, unspecified trimester: Secondary | ICD-10-CM

## 2015-10-25 DIAGNOSIS — Z8719 Personal history of other diseases of the digestive system: Secondary | ICD-10-CM | POA: Diagnosis not present

## 2015-10-25 DIAGNOSIS — W108XXA Fall (on) (from) other stairs and steps, initial encounter: Secondary | ICD-10-CM | POA: Diagnosis not present

## 2015-10-25 DIAGNOSIS — W19XXXA Unspecified fall, initial encounter: Secondary | ICD-10-CM

## 2015-10-25 DIAGNOSIS — O10012 Pre-existing essential hypertension complicating pregnancy, second trimester: Secondary | ICD-10-CM | POA: Diagnosis not present

## 2015-10-25 DIAGNOSIS — O99342 Other mental disorders complicating pregnancy, second trimester: Secondary | ICD-10-CM | POA: Diagnosis not present

## 2015-10-25 DIAGNOSIS — R109 Unspecified abdominal pain: Secondary | ICD-10-CM

## 2015-10-25 LAB — URINALYSIS, ROUTINE W REFLEX MICROSCOPIC
Bilirubin Urine: NEGATIVE
GLUCOSE, UA: NEGATIVE mg/dL
Ketones, ur: 15 mg/dL — AB
Nitrite: NEGATIVE
PH: 7 (ref 5.0–8.0)
PROTEIN: NEGATIVE mg/dL
SPECIFIC GRAVITY, URINE: 1.017 (ref 1.005–1.030)

## 2015-10-25 LAB — URINE MICROSCOPIC-ADD ON

## 2015-10-25 NOTE — Discharge Instructions (Signed)
Abdominal Pain, Adult Your baby looks normal on ultrasound. Follow up with your [facility] doctor tomorrow. Return to the ED if you develop worsening pain, bleeding, any other concerns. Many things can cause abdominal pain. Usually, abdominal pain is not caused by a disease and will improve without treatment. It can often be observed and treated at home. Your health care provider will do a physical exam and possibly order blood tests and X-rays to help determine the seriousness of your pain. However, in many cases, more time must pass before a clear cause of the pain can be found. Before that point, your health care provider may not know if you need more testing or further treatment. HOME CARE INSTRUCTIONS Monitor your abdominal pain for any changes. The following actions may help to alleviate any discomfort you are experiencing:  Only take over-the-counter or prescription medicines as directed by your health care provider.  Do not take laxatives unless directed to do so by your health care provider.  Try a clear liquid diet (broth, tea, or water) as directed by your health care provider. Slowly move to a bland diet as tolerated. SEEK MEDICAL CARE IF:  You have unexplained abdominal pain.  You have abdominal pain associated with nausea or diarrhea.  You have pain when you urinate or have a bowel movement.  You experience abdominal pain that wakes you in the night.  You have abdominal pain that is worsened or improved by eating food.  You have abdominal pain that is worsened with eating fatty foods.  You have a fever. SEEK IMMEDIATE MEDICAL CARE IF:  Your pain does not go away within 2 hours.  You keep throwing up (vomiting).  Your pain is felt only in portions of the abdomen, such as the right side or the left lower portion of the abdomen.  You pass bloody or black tarry stools. MAKE SURE YOU:  Understand these instructions.  Will watch your condition.  Will get help right away if  you are not doing well or get worse.   This information is not intended to replace advice given to you by your health care provider. Make sure you discuss any questions you have with your health care provider.   Document Released: 07/04/2005 Document Revised: 06/15/2015 Document Reviewed: 06/03/2013 Elsevier Interactive Patient Education [employer]

## 2015-10-25 NOTE — ED Notes (Signed)
Gave pt. H2O to drink to help with poss. Urine sample.  Pt. Reports she is unable to urinate at this time.

## 2015-10-25 NOTE — ED Provider Notes (Addendum)
CSN: [id number]     Arrival date & time 10/25/15  1845 History  By signing my name below, I, Diana Collier, attest that this documentation has been prepared under the direction and in the presence of Glynn Octave, MD. Electronically Signed: Octavia Heir, ED Scribe. 10/25/2015. 8:30 PM.    Chief Complaint  Patient presents with  . Abdominal Pain     The history is provided by the patient. No language interpreter was used.   HPI Comments: Georgie Eduardo is a 35 y.o. female who is [redacted] weeks pregnant and has a hx of HTN presents to the Emergency Department complaining of constant, gradual worsening, right sided and lower abdominal pain with associated nausea onset this morning. Pt states she slipped on toy, tripped forward and fell down 10 steps this morning at about 5:45 am. Pt landed on carpet and did she not hit her head or lose consciousness. Pt landed on her right side. She has not had any complications with this pregnancy so far. She denies headache, chest pain, vomiting, fluid leaking, vaginal bleeding, contractions, neck pain, and dysuria.  Past Medical History  Diagnosis Date  . Anemia   . Anxiety   . Blood transfusion   . Cataract   . Depression   . GERD (gastroesophageal reflux disease)   . Hypertension   . Thyroid disease    Past Surgical History  Procedure Laterality Date  . Eye surgery     Family History  Problem Relation Age of Onset  . Hypertension Father   . Alcohol abuse Father     substance abuse  . Gout Father   . Hearing loss Brother   . Alcohol abuse Brother   . Hypothyroidism Maternal Aunt   . COPD Maternal Grandmother     emphysema, cong. heart failure  . Cancer Maternal Grandmother     colon  . Clotting disorder Maternal Grandmother   . Hypertension Maternal Aunt   . Depression Maternal Aunt    Social History  Substance Use Topics  . Smoking status: Never Smoker   . Smokeless tobacco: None  . Alcohol Use: No   OB History    Gravida Para  Term Preterm AB TAB SAB Ectopic Multiple Living   1              Review of Systems  A complete 10 system review of systems was obtained and all systems are negative except as noted in the HPI and PMH.    Allergies  Review of patient's allergies indicates no known allergies.  Home Medications   Prior to Admission medications   Medication Sig Start Date End Date Taking? Authorizing Provider  meclizine (ANTIVERT) 25 MG tablet Take 1 tablet (25 mg total) by mouth 3 (three) times daily as needed for nausea. 08/12/15   Jean Rosenthal, NP  methimazole (TAPAZOLE) 10 MG tablet Take 10 mg by mouth 3 (three) times daily.      Historical Provider, MD  methimazole (TAPAZOLE) 20 MG tablet Take 40 mg by mouth 2 (two) times daily.   04/10/11   Historical Provider, MD  Prenatal Vit-Fe Psac Cmplx-FA (PRENATAL MULTIVITAMIN) 60-1 MG tablet Take 1 tablet by mouth daily.      Historical Provider, MD  promethazine (PHENERGAN) 25 MG tablet Take 1 tablet (25 mg total) by mouth every 6 (six) hours as needed for nausea or vomiting. 08/12/15   Jean Rosenthal, NP  propranolol (INDERAL) 20 MG tablet Take 20 mg by mouth at bedtime.  Historical Provider, MD   Triage vitals: BP 116/59 mmHg  Pulse 89  Temp(Src) 98.6 F (37 C) (Oral)  Resp 20  Ht  (1.676 m)  Wt 217 lb (98.431 kg)  BMI 35.04 kg/m2  SpO2 20%  LMP 06/23/2015 (Approximate) Physical Exam  Constitutional: She is oriented to person, place, and time. She appears well-developed and well-nourished. No distress.  HENT:  Head: Normocephalic and atraumatic.  Mouth/Throat: Oropharynx is clear and moist. No oropharyngeal exudate.  Eyes: Conjunctivae and EOM are normal. Pupils are equal, round, and reactive to light.  Neck: Normal range of motion. Neck supple.  No meningismus.  Cardiovascular: Normal rate, regular rhythm, normal heart sounds and intact distal pulses.   No murmur heard. Pulmonary/Chest: Effort normal and breath sounds normal. No  respiratory distress.  Abdominal: Soft. There is tenderness. There is no rebound and no guarding.  Gravid abdomen, suprapubic and right sided lower abdominal tenderness, no ecchymosis   Musculoskeletal: Normal range of motion. She exhibits no edema or tenderness.  No CTL spine tenderness FROM hips without pain.  Neurological: She is alert and oriented to person, place, and time. No cranial nerve deficit. She exhibits normal muscle tone. Coordination normal.  No ataxia on finger to nose bilaterally. No pronator drift. 5/5 strength throughout. CN 2-12 intact.Equal grip strength. Sensation intact.   Skin: Skin is warm.  Psychiatric: She has a normal mood and affect. Her behavior is normal.  Nursing note and vitals reviewed.   ED Course  Procedures  COORDINATION OF CARE:  8:26 PM Discussed treatment plan which includes urinaly with pt at bedside and pt agreed to plan.  Labs Review Labs Reviewed  URINALYSIS, ROUTINE W REFLEX MICROSCOPIC (NOT AT [facility]) - Abnormal; Notable for the following:    APPearance CLOUDY (*)    Hgb urine dipstick SMALL (*)    Ketones, ur 15 (*)    Leukocytes, UA TRACE (*)    All other components within normal limits  URINE MICROSCOPIC-ADD ON - Abnormal; Notable for the following:    Squamous Epithelial / LPF 6-30 (*)    Bacteria, UA RARE (*)    All other components within normal limits    Imaging Review US Ob Limited  10/25/2015  CLINICAL DATA:  Larey Seat down 10 steps this morning.  Abdominal pain. EXAM: LIMITED OBSTETRIC ULTRASOUND FINDINGS: Number of Fetuses: 1 Heart Rate:  157 bpm Movement: Yes Presentation: Cephalic Placental Location: Anterior Previa: No Amniotic Fluid (Subjective):  Within normal limits. BPD:  3.79cm 17w  4d MATERNAL FINDINGS: Cervix:  Appears closed. Uterus/Adnexae:  No abnormality visualized. IMPRESSION: Approximately 17-18 week intrauterine gestation. Fetal heart rate 157 beats per minute. No acute maternal findings. This exam is performed on  an emergent basis and does not comprehensively evaluate fetal size, dating, or anatomy; follow-up complete OB US should be considered if further fetal assessment is warranted. Electronically Signed   By: Charlett Nose M.D.   On: 10/25/2015 20:27   I have personally reviewed and evaluated these images and lab results as part of my medical decision-making.   EKG Interpretation None      MDM   Final diagnoses:  Abdominal pain affecting pregnancy   Patient is [redacted] weeks pregnant. States she had a mechanical fall down approximately 10 steps this morning. Fall was > 15 hours ago. No vaginal leakage or bleeding. Did not hit head or lose consciousness. No neck or back pain.  Fetal heart tones 150s. Ultrasound shows no complicating features. UA appears contaminated.  D/w Dr. Donnie Mesa of [facility].  He agrees There is no need for further fetal monitoring as fetus is not viable.  No other injuries. Patient is tolerating by mouth and ambulatory. Follow up with her OB doctor tomorrow. Return precautions discussed.  20% on triage pulse ox was not accurate.  100% on recheck.  BP 121/54 mmHg  Pulse 84  Temp(Src) 98.4 F (36.9 C) (Oral)  Resp 18  Ht  (1.676 m)  Wt 217 lb (98.431 kg)  BMI 35.04 kg/m2  SpO2 100%  LMP 06/23/2015 (Approximate)   I personally performed the services described in this documentation, which was scribed in my presence. The recorded information has been reviewed and is accurate.   Glynn Octave, MD 10/26/15 1610  Glynn Octave, MD 10/26/15 1346

## 2015-10-25 NOTE — ED Notes (Addendum)
Abdominal pain. She is [redacted] weeks pregnant. Denies vaginal leakage. She tripped and fell down 10 steps this am.

## 2016-06-16 ENCOUNTER — Encounter (HOSPITAL_COMMUNITY): Payer: Self-pay

## 2020-01-01 ENCOUNTER — Ambulatory Visit: Payer: Managed Care, Other (non HMO)

## 2020-08-03 ENCOUNTER — Emergency Department (HOSPITAL_BASED_OUTPATIENT_CLINIC_OR_DEPARTMENT_OTHER)
Admission: EM | Admit: 2020-08-03 | Discharge: 2020-08-03 | Disposition: A | Discharge status: Home / Self Care | Payer: Managed Care, Other (non HMO) | Attending: Emergency Medicine | Admitting: Emergency Medicine

## 2020-08-03 ENCOUNTER — Encounter (HOSPITAL_BASED_OUTPATIENT_CLINIC_OR_DEPARTMENT_OTHER): Payer: Self-pay | Admitting: *Deleted

## 2020-08-03 ENCOUNTER — Other Ambulatory Visit: Payer: Self-pay

## 2020-08-03 DIAGNOSIS — W57XXXA Bitten or stung by nonvenomous insect and other nonvenomous arthropods, initial encounter: Secondary | ICD-10-CM | POA: Diagnosis not present

## 2020-08-03 DIAGNOSIS — Z5321 Procedure and treatment not carried out due to patient leaving prior to being seen by health care provider: Secondary | ICD-10-CM | POA: Diagnosis not present

## 2020-08-03 DIAGNOSIS — S60562A Insect bite (nonvenomous) of left hand, initial encounter: Secondary | ICD-10-CM | POA: Insufficient documentation

## 2020-08-03 DIAGNOSIS — S60469A Insect bite (nonvenomous) of unspecified finger, initial encounter: Secondary | ICD-10-CM | POA: Diagnosis not present

## 2020-08-03 NOTE — ED Triage Notes (Signed)
C/o spider bite x 2 days ago to left hand/ fingers

## 2020-11-24 ENCOUNTER — Emergency Department (HOSPITAL_BASED_OUTPATIENT_CLINIC_OR_DEPARTMENT_OTHER)
Admission: EM | Admit: 2020-11-24 | Discharge: 2020-11-24 | Disposition: A | Discharge status: Home / Self Care | Payer: Managed Care, Other (non HMO) | Attending: Emergency Medicine | Admitting: Emergency Medicine

## 2020-11-24 ENCOUNTER — Encounter (HOSPITAL_BASED_OUTPATIENT_CLINIC_OR_DEPARTMENT_OTHER): Payer: Self-pay | Admitting: Emergency Medicine

## 2020-11-24 ENCOUNTER — Other Ambulatory Visit: Payer: Self-pay

## 2020-11-24 ENCOUNTER — Emergency Department (HOSPITAL_BASED_OUTPATIENT_CLINIC_OR_DEPARTMENT_OTHER): Payer: Managed Care, Other (non HMO)

## 2020-11-24 DIAGNOSIS — Z79899 Other long term (current) drug therapy: Secondary | ICD-10-CM | POA: Insufficient documentation

## 2020-11-24 DIAGNOSIS — I10 Essential (primary) hypertension: Secondary | ICD-10-CM | POA: Diagnosis not present

## 2020-11-24 DIAGNOSIS — H538 Other visual disturbances: Secondary | ICD-10-CM | POA: Insufficient documentation

## 2020-11-24 DIAGNOSIS — R519 Headache, unspecified: Secondary | ICD-10-CM | POA: Diagnosis present

## 2020-11-24 LAB — CBC WITH DIFFERENTIAL/PLATELET
Abs Immature Granulocytes: 0.01 10*3/uL (ref 0.00–0.07)
Basophils Absolute: 0 10*3/uL (ref 0.0–0.1)
Basophils Relative: 1 %
Eosinophils Absolute: 0.1 10*3/uL (ref 0.0–0.5)
Eosinophils Relative: 1 %
HCT: 42.3 % (ref 36.0–46.0)
Hemoglobin: 13.6 g/dL (ref 12.0–15.0)
Immature Granulocytes: 0 %
Lymphocytes Relative: 25 %
Lymphs Abs: 1.6 10*3/uL (ref 0.7–4.0)
MCH: 29 pg (ref 26.0–34.0)
MCHC: 32.2 g/dL (ref 30.0–36.0)
MCV: 90.2 fL (ref 80.0–100.0)
Monocytes Absolute: 0.6 10*3/uL (ref 0.1–1.0)
Monocytes Relative: 9 %
Neutro Abs: 4 10*3/uL (ref 1.7–7.7)
Neutrophils Relative %: 64 %
Platelets: 336 10*3/uL (ref 150–400)
RBC: 4.69 MIL/uL (ref 3.87–5.11)
RDW: 13.2 % (ref 11.5–15.5)
WBC: 6.3 10*3/uL (ref 4.0–10.5)
nRBC: 0 % (ref 0.0–0.2)

## 2020-11-24 LAB — COMPREHENSIVE METABOLIC PANEL
ALT: 19 U/L (ref 0–44)
AST: 22 U/L (ref 15–41)
Albumin: 4.3 g/dL (ref 3.5–5.0)
Alkaline Phosphatase: 45 U/L (ref 38–126)
Anion gap: 9 (ref 5–15)
BUN: 9 mg/dL (ref 6–20)
CO2: 26 mmol/L (ref 22–32)
Calcium: 9.4 mg/dL (ref 8.9–10.3)
Chloride: 103 mmol/L (ref 98–111)
Creatinine, Ser: 0.8 mg/dL (ref 0.44–1.00)
GFR, Estimated: >60 mL/min (ref >60)
Glucose, Bld: 84 mg/dL (ref 70–99)
Potassium: 4.2 mmol/L (ref 3.5–5.1)
Sodium: 138 mmol/L (ref 135–145)
Total Bilirubin: 0.6 mg/dL (ref 0.3–1.2)
Total Protein: 8.2 g/dL — ABNORMAL HIGH (ref 6.5–8.1)

## 2020-11-24 LAB — PREGNANCY, URINE: Preg Test, Ur: NEGATIVE

## 2020-11-24 MED ORDER — KETOROLAC TROMETHAMINE 30 MG/ML IJ SOLN
15.0000 mg | Freq: Once | INTRAMUSCULAR | Status: DC
Start: 2020-11-24 — End: 2020-11-24

## 2020-11-24 MED ORDER — SODIUM CHLORIDE 0.9 % IV BOLUS
500.0000 mL | Freq: Once | INTRAVENOUS | Status: AC
  Administered 2020-11-24: 500 mL via INTRAVENOUS

## 2020-11-24 MED ORDER — IOHEXOL 350 MG/ML SOLN
100.0000 mL | Freq: Once | INTRAVENOUS | Status: AC | PRN
  Administered 2020-11-24: 100 mL via INTRAVENOUS

## 2020-11-24 MED ORDER — METOCLOPRAMIDE HCL 5 MG/ML IJ SOLN
10.0000 mg | Freq: Once | INTRAMUSCULAR | Status: AC
  Administered 2020-11-24: 10 mg via INTRAVENOUS
  Filled 2020-11-24: qty 2

## 2020-11-24 MED ORDER — METOCLOPRAMIDE HCL 5 MG/ML IJ SOLN: 10.0000 mg | Freq: Once | INTRAMUSCULAR | Status: DC

## 2020-11-24 NOTE — ED Triage Notes (Signed)
Pt reports headache approximately 3 days.  Blood pressure 800 am was 170/110. Took home medication losartan 25 mg and propranolol 50 this am.  Reports hr at 135.

## 2020-11-24 NOTE — ED Notes (Signed)
Went to check on patient and she stated that she is ready to go home.  She said that nobody has come and talk to her regarding her CT scan and lab results.  Informed her that when all the results are in, the doctor will come and talk to her.  Pt is kind of upset stating that she is hungry.  Crackers and apple juice given.

## 2020-11-24 NOTE — ED Provider Notes (Addendum)
[facility] EMERGENCY DEPARTMENT Provider Note   CSN: [id number] Arrival date & time: 11/24/20  9528     History Chief Complaint  Patient presents with  . Hypertension  . Headache    Diana Collier is a 40 y.o. female.  HPI    40 year old female comes in primarily with chief complaint of headache.  She has also noted that her blood pressures are running slightly high, with systolic in the 160s and 170s.  Patient has history of hypertension.  She has family history of brain aneurysm and reports that her mother has some sort of clotting disorder for which she is on blood thinners.  Patient does not have any history of headaches.  She reports that her current headache has been present for 3 days.  The headache is focal over the right temporal region.  It is described as sharp pain.  Her headache is worse when she stands up.  Otherwise, the headache is not worse when she lays flat or with light and it is not worse at a particular time of the day either.  Patient has taken Excedrin and other OTC medications without significant relief.  She reports bilateral blurry vision, right worse than left.  Patient does not have any focal numbness, weakness, vision change, slurred speech.  Past Medical History:  Diagnosis Date  . Anemia   . Anxiety   . Blood transfusion   . Cataract   . Depression   . GERD (gastroesophageal reflux disease)   . Hypertension   . Thyroid disease     Patient Active Problem List   Diagnosis Date Noted  . LGSIL (low grade squamous intraepithelial lesion) on Pap smear 03/04/2013    Past Surgical History:  Procedure Laterality Date  . EYE SURGERY       OB History    Gravida  1   Para      Term      Preterm      AB      Living        SAB      IAB      Ectopic      Multiple      Live Births              Family History  Problem Relation Age of Onset  . Hypertension Father   . Alcohol abuse Father        substance abuse   . Gout Father   . Hearing loss Brother   . Alcohol abuse Brother   . Hypothyroidism Maternal Aunt   . Hypertension Maternal Aunt   . Depression Maternal Aunt   . COPD Maternal Grandmother        emphysema, cong. heart failure  . Cancer Maternal Grandmother        colon  . Clotting disorder Maternal Grandmother     Social History   Tobacco Use  . Smoking status: Never Smoker  . Smokeless tobacco: Never Used  Substance Use Topics  . Alcohol use: Yes    Comment: socialy  . Drug use: No    Home Medications Prior to Admission medications   Medication Sig Start Date End Date Taking? Authorizing Provider  losartan (COZAAR) 50 MG tablet Take 50 mg by mouth daily.   Yes [provider]  propranolol (INDERAL) 20 MG tablet Take 20 mg by mouth at bedtime.   Yes [provider]  meclizine (ANTIVERT) 25 MG tablet Take 1 tablet (25 mg total) by  mouth 3 (three) times daily as needed for nausea. 08/12/15   Jean Rosenthal, NP  methimazole (TAPAZOLE) 10 MG tablet Take 10 mg by mouth 3 (three) times daily.    [provider]  methimazole (TAPAZOLE) 20 MG tablet Take 40 mg by mouth 2 (two) times daily. 04/10/11   [provider]    Allergies    Patient has no known allergies.  Review of Systems   Review of Systems  Constitutional: Positive for activity change. Negative for diaphoresis.  Eyes: Positive for visual disturbance. Negative for photophobia.  Respiratory: Negative for shortness of breath.   Cardiovascular: Negative for chest pain.  Gastrointestinal: Negative for abdominal distention and abdominal pain.  Musculoskeletal: Negative for neck pain.  Neurological: Positive for headaches. Negative for speech difficulty.  Hematological: Does not bruise/bleed easily.  Psychiatric/Behavioral: Negative for confusion.  All other systems reviewed and are negative.   Physical Exam Updated Vital Signs BP 133/86   Pulse 66   Temp 98 F (36.7 C)    Resp 15   Ht 5\' 6"  (1.676 m)   Wt 105.2 kg   LMP 11/08/2020   SpO2 98%   BMI 37.45 kg/m   Physical Exam Vitals and nursing note reviewed.  Constitutional:      Appearance: She is well-developed.  HENT:     Head: Normocephalic and atraumatic.  Eyes:     General: No visual field deficit.    Extraocular Movements: EOM normal.     Comments: Bilateral blurry vision, right worse than left.  EOMI, no visual field deficits  Cardiovascular:     Rate and Rhythm: Normal rate.  Pulmonary:     Effort: Pulmonary effort is normal.  Abdominal:     General: Bowel sounds are normal.  Musculoskeletal:     Cervical back: Normal range of motion and neck supple.  Skin:    General: Skin is warm and dry.  Neurological:     Mental Status: She is alert and oriented to person, place, and time.     GCS: GCS eye subscore is 4. GCS verbal subscore is 5. GCS motor subscore is 6.     Cranial Nerves: No dysarthria or facial asymmetry.     Sensory: No sensory deficit.     Motor: No weakness.     ED Results / Procedures / Treatments   Labs (all labs ordered are listed, but only abnormal results are displayed) Labs Reviewed  COMPREHENSIVE METABOLIC PANEL - Abnormal; Notable for the following components:      Result Value   Total Protein 8.2 (*)    All other components within normal limits  CBC WITH DIFFERENTIAL/PLATELET  PREGNANCY, URINE    EKG EKG Interpretation  Date/Time:  Thursday November 24 2020 08:35:26 EST Ventricular Rate:  82 PR Interval:    QRS Duration: 91 QT Interval:  440 QTC Calculation: 514 R Axis:   71 Text Interpretation: Sinus rhythm Borderline T wave abnormalities Prolonged QT interval No acute changes No significant change since last tracing Confirmed by [id number] [phone]) on 11/24/2020 12:02:16 PM   Radiology No results found.  Procedures Procedures   Medications Ordered in ED Medications  metoCLOPramide (REGLAN) injection 10 mg (10 mg Intravenous Given  11/24/20 0914)  sodium chloride 0.9 % bolus 500 mL (0 mLs Intravenous Stopped 11/24/20 1129)    ED Course  I have reviewed the triage vital signs and the nursing notes.  Pertinent labs & imaging results that were available during my care of  the patient were reviewed by me and considered in my medical decision making (see chart for details).  Clinical Course as of 11/24/20 1515  Thu Nov 24, 2020  1514 Upon reassessment, patient reports that the headache has resolved. She continued to have no neurologic complains. Strict return precautions discussed, pt will return to the ER if there is visual complains, seizures, altered mental status, loss of consciousness, dizziness, new focal weakness, or numbness.    [AN]  1514 CT Angio Head W or Wo Contrast The patient appears reasonably screened and/or stabilized for discharge and I doubt any other medical condition or other [facility] requiring further screening, evaluation, or treatment in the ED at this time prior to discharge.  Results from the ER workup discussed with the patient face to face and all questions answered to the best of my ability. The patient is safe for discharge with strict return precautions.   [AN]    Clinical Course User Index [AN] Derwood Kaplan, MD   MDM Rules/Calculators/A&P                          40 year old female comes in a chief complaint of new onset, severe headache.  Headache has been present for 3 days.  There is family history of brain aneurysm and also clotting disorder.  Patient has never been diagnosed with either problems.  History is not suggestive of clear source of primary headache such as migraine. Her symptoms of blurry vision are binocular, therefore less likely for this to be glaucoma or uveitis. She is too young to have temporal arteritis.  Plan is to get CT angiogram head and neck.  If her work-up is negative, then we will have her follow-up with neurologist.  She might need CT venogram as well to  rule out thrombosis.  Patient does not carry any risk factors for thrombosis besides family history of clotting disorder  Patient was reassessed at 44.  She reports improvement in her pain with just IV Reglan.,  We will proceed with the plan to get CT angiogram.  I have considered subarachnoid hemorrhage in my differential.  Patient does not have poorly controlled blood pressure, smoking, substance abuse history and there is no sudden brain bleeds in the family.  If she were to have subarachnoid hemorrhage, the origin would be aneurysm, therefore CT angiogram has been ordered.  If CT is negative, I do not think she needs LP.  I will have a shared decision making with her once the CT angiogram results.  Final Clinical Impression(s) / ED Diagnoses Final diagnoses:  None    Rx / DC Orders ED Discharge Orders    None       Derwood Kaplan, MD 11/24/20 1217

## 2020-11-24 NOTE — Discharge Instructions (Addendum)
You are seen in the ER for severe headache.  Our work-up here which included CT angiogram and blood work is reassuring. We recommend that you follow-up with a neurologist in 1 week.  Until then, take ibuprofen 600 mg every 8 hours for the next 3 days. You can take Tylenol 650 mg in addition if needed.  Please return to the ER if the headache gets severe and in not improving, you have associated new one sided numbness, tingling, weakness or confusion, seizures, poor balance or poor vision.

## 2020-11-24 NOTE — ED Notes (Signed)
ED Provider at bedside. 

## 2020-11-24 NOTE — ED Notes (Signed)
Pt here with high blood pressure and headache. Pt reports nausea and blurred vision.

## 2024-07-01 ENCOUNTER — Ambulatory Visit: Payer: Self-pay | Admitting: Internal Medicine
